# Patient Record
Sex: Female | Born: 1990 | Race: Black or African American | Hispanic: No | Marital: Single | Smoking: Never smoker
Health system: Southern US, Community
[De-identification: ages and names within clinical notes are randomized; demographics above are authoritative.]

## PROBLEM LIST (undated history)

## (undated) DIAGNOSIS — E559 Vitamin D deficiency, unspecified: Secondary | ICD-10-CM

## (undated) DIAGNOSIS — B019 Varicella without complication: Secondary | ICD-10-CM

## (undated) HISTORY — DX: Vitamin D deficiency, unspecified: E55.9

## (undated) HISTORY — DX: Varicella without complication: B01.9

---

## 2014-09-06 ENCOUNTER — Encounter: Payer: Self-pay | Admitting: Family Medicine

## 2014-09-06 ENCOUNTER — Ambulatory Visit (INDEPENDENT_AMBULATORY_CARE_PROVIDER_SITE_OTHER): Payer: BLUE CROSS/BLUE SHIELD | Admitting: Family Medicine

## 2014-09-06 VITALS — BP 124/72 | HR 72 | Temp 95.5°F | Ht 61.5 in | Wt 96.2 lb

## 2014-09-06 DIAGNOSIS — R300 Dysuria: Secondary | ICD-10-CM | POA: Diagnosis not present

## 2014-09-06 DIAGNOSIS — N3000 Acute cystitis without hematuria: Secondary | ICD-10-CM

## 2014-09-06 LAB — POCT URINALYSIS DIPSTICK
BILIRUBIN UA: NEGATIVE
Glucose, UA: NEGATIVE
Ketones, UA: NEGATIVE
NITRITE UA: NEGATIVE
UROBILINOGEN UA: NEGATIVE
pH, UA: 6

## 2014-09-06 MED ORDER — CIPROFLOXACIN HCL 250 MG PO TABS: 250.0000 mg | ORAL_TABLET | Freq: Two times a day (BID) | ORAL | Status: DC

## 2014-09-06 NOTE — Patient Instructions (Signed)
Take the antibiotics twice daily for 3 days.  Call in 2 days if you aren't any better.  Call/return immediately if high fevers, vomiting, flank pain.  Return for physical/pap smear.  Urinary Tract Infection Urinary tract infections (UTIs) can develop anywhere along your urinary tract. Your urinary tract is your body's drainage system for removing wastes and extra water. Your urinary tract includes two kidneys, two ureters, a bladder, and a urethra. Your kidneys are a pair of bean-shaped organs. Each kidney is about the size of your fist. They are located below your ribs, one on each side of your spine. CAUSES Infections are caused by microbes, which are microscopic organisms, including fungi, viruses, and bacteria. These organisms are so small that they can only be seen through a microscope. Bacteria are the microbes that most commonly cause UTIs. SYMPTOMS  Symptoms of UTIs may vary by age and gender of the patient and by the location of the infection. Symptoms in young women typically include a frequent and intense urge to urinate and a painful, burning feeling in the bladder or urethra during urination. Older women and men are more likely to be tired, shaky, and weak and have muscle aches and abdominal pain. A fever may mean the infection is in your kidneys. Other symptoms of a kidney infection include pain in your back or sides below the ribs, nausea, and vomiting. DIAGNOSIS To diagnose a UTI, your caregiver will ask you about your symptoms. Your caregiver also will ask to provide a urine sample. The urine sample will be tested for bacteria and white blood cells. White blood cells are made by your body to help fight infection. TREATMENT  Typically, UTIs can be treated with medication. Because most UTIs are caused by a bacterial infection, they usually can be treated with the use of antibiotics. The choice of antibiotic and length of treatment depend on your symptoms and the type of bacteria causing  your infection. HOME CARE INSTRUCTIONS  If you were prescribed antibiotics, take them exactly as your caregiver instructs you. Finish the medication even if you feel better after you have only taken some of the medication.  Drink enough water and fluids to keep your urine clear or pale yellow.  Avoid caffeine, tea, and carbonated beverages. They tend to irritate your bladder.  Empty your bladder often. Avoid holding urine for long periods of time.  Empty your bladder before and after sexual intercourse.  After a bowel movement, women should cleanse from front to back. Use each tissue only once. SEEK MEDICAL CARE IF:   You have back pain.  You develop a fever.  Your symptoms do not begin to resolve within 3 days. SEEK IMMEDIATE MEDICAL CARE IF:   You have severe back pain or lower abdominal pain.  You develop chills.  You have nausea or vomiting.  You have continued burning or discomfort with urination. MAKE SURE YOU:   Understand these instructions.  Will watch your condition.  Will get help right away if you are not doing well or get worse. Document Released: 03/07/2005 Document Revised: 11/27/2011 Document Reviewed: 07/06/2011 [facility] Patient Information 2015 [place], [place]. This information is not intended to replace advice given to you by your health care provider. Make sure you discuss any questions you have with your health care provider.

## 2014-09-06 NOTE — Progress Notes (Signed)
Chief Complaint  Patient presents with  . UTI    new pt possible UTI, frequency, discomfort when urinating.    She presents with complaint of frequent urination and painful urination (pain where the urine exits); frequent voids, small amounts.  No blood in the urine.  Denies lower abdominal pain or flank pain.  Symptoms started 2 days ago, hasn't changed in intensity.  No fevers, chills, nausea, or vomiting.  Symptoms started in the afternoon 2 days ago, but earlier that morning she had some urinary incontinence (while having a dream that she was going to the bathroom and voiding, but she was really still in bed).  She has had one UTI in the past (3 years ago).  This feels similar, but not as bad, catching it early.  She is trying to drink a lot of water.  Denies vaginal discharge, odor, itch. LMP 3/16-20. She is in a sexual relationship, uses condoms every time. She reports she has never had a pap smear.  History reviewed. No pertinent past medical history.  History reviewed. No pertinent past surgical history.  History   Social History  . Marital Status: Single    Spouse Name: N/A  . Number of Children: N/A  . Years of Education: N/A   Occupational History  . student    Social History Main Topics  . Smoking status: Never Smoker   . Smokeless tobacco: Never Used  . Alcohol Use: 0.0 oz/week    0 Standard drinks or equivalent per week     Comment: occasionally  . Drug Use: No  . Sexual Activity:    Partners: Male    Birth Control/ Protection: Condom   Other Topics Concern  . Not on file   Social History Narrative   Student at [employer], lives on campus.  [place]. Studying nursing and psychology.  Will graduate 05/2015    Family History  Problem Relation Age of Onset  . Hypertension Mother   . Hyperlipidemia Father   . Cancer Maternal Grandmother     breast  . Breast cancer Maternal Grandmother [id number]  . Diabetes Maternal Grandfather   . Diabetes Paternal  Grandfather   . Heart disease Neg Hx   . Stroke Neg Hx     Outpatient Encounter Prescriptions as of 09/06/2014  Medication Sig  . ciprofloxacin (CIPRO) 250 MG tablet Take 1 tablet (250 mg total) by mouth 2 (two) times daily.    No Known Allergies  ROS:  No fevers, chills, headaches, dizziness.  No nausea, vomiting, diarrhea.  Some mild allergies--sneezing, itchy eyes and slight cough. No ear pain, sore throat. No bleeding, bruising, rashes.  No joint pain, swelling.  No mood changes or insomnia. No chest pain, palpitations or shortness of breath. Urinary complaints as per HPI.    PHYSICAL EXAM: BP 124/72 mmHg  Pulse 72  Temp(Src) 95.5 F (35.3 C) (Temporal)  Ht 5' 1.5" (1.562 m)  Wt 96 lb 3.2 oz (43.636 kg)  BMI 17.88 kg/m2 Well developed, petite, pleasant female in no distress HEENT: PERRL, EOMI, conjunctiva clear.  TM's normal, OP clear Neck: no lymphadenopathy, thyromegaly or mass Heart: regular rate and rhythm Lungs: clear bilaterally Abdomen: soft, nontender, no organomegaly or mass. No suprapubic tenderness Back: no spinal or CVA tenderness Extremities: no edema, 2+ pulse Skin: no rashes/lesions Psych: normal mood, affect, hygiene and grooming Neuro: alert and oriented.  Cranial nerves intact, normal strength, gait   Urine dip:  1+ blood, 1+ leuks, SG 1.030  ASSESSMENT/PLAN:  Acute cystitis without hematuria - Plan: ciprofloxacin (CIPRO) 250 MG tablet, Urine culture, CANCELED: Urine culture  Dysuria - Plan: POCT urinalysis dipstick   F/u for CPE/pap. To get copy of immunization for visit  Reviewed plan B; continue regular condom use. Discussed use of AZO for no more than 2 days, if needed.    Advised of s/sx of worsening urinary infection, expected course.

## 2014-09-09 LAB — URINE CULTURE: Colony Count: >=100000

## 2014-10-14 ENCOUNTER — Encounter: Payer: Self-pay | Admitting: Family Medicine

## 2014-10-14 ENCOUNTER — Ambulatory Visit (INDEPENDENT_AMBULATORY_CARE_PROVIDER_SITE_OTHER): Payer: BLUE CROSS/BLUE SHIELD | Admitting: Family Medicine

## 2014-10-14 VITALS — BP 120/70 | HR 68 | Temp 96.8°F | Ht 61.0 in | Wt 97.0 lb

## 2014-10-14 DIAGNOSIS — N3000 Acute cystitis without hematuria: Secondary | ICD-10-CM

## 2014-10-14 DIAGNOSIS — R300 Dysuria: Secondary | ICD-10-CM

## 2014-10-14 LAB — POCT URINALYSIS DIPSTICK
BILIRUBIN UA: NEGATIVE
Glucose, UA: NEGATIVE
Ketones, UA: NEGATIVE
Nitrite, UA: NEGATIVE
PH UA: 6
Spec Grav, UA: >=1.03
Urobilinogen, UA: NEGATIVE

## 2014-10-14 MED ORDER — SULFAMETHOXAZOLE-TRIMETHOPRIM 800-160 MG PO TABS: 1.0000 | ORAL_TABLET | Freq: Two times a day (BID) | ORAL | Status: DC

## 2014-10-14 NOTE — Patient Instructions (Addendum)
Drink plenty of water. Take antibiotic twice daily with food for a week (treating longer, since recurrence was pretty quick). Call us if you aren't tolerating this medication, to have it changed. Call us if you develop fever, chills, flank pain, vomiting, or other new concerns. Remember to wipe front to back, and void after intercourse.  Try and drink cranberry juice at first symptoms of any possible recurrence.   Urinary Tract Infection Urinary tract infections (UTIs) can develop anywhere along your urinary tract. Your urinary tract is your body's drainage system for removing wastes and extra water. Your urinary tract includes two kidneys, two ureters, a bladder, and a urethra. Your kidneys are a pair of bean-shaped organs. Each kidney is about the size of your fist. They are located below your ribs, one on each side of your spine. CAUSES Infections are caused by microbes, which are microscopic organisms, including fungi, viruses, and bacteria. These organisms are so small that they can only be seen through a microscope. Bacteria are the microbes that most commonly cause UTIs. SYMPTOMS  Symptoms of UTIs may vary by age and gender of the patient and by the location of the infection. Symptoms in young women typically include a frequent and intense urge to urinate and a painful, burning feeling in the bladder or urethra during urination. Older women and men are more likely to be tired, shaky, and weak and have muscle aches and abdominal pain. A fever may mean the infection is in your kidneys. Other symptoms of a kidney infection include pain in your back or sides below the ribs, nausea, and vomiting. DIAGNOSIS To diagnose a UTI, your caregiver will ask you about your symptoms. Your caregiver also will ask to provide a urine sample. The urine sample will be tested for bacteria and white blood cells. White blood cells are made by your body to help fight infection. TREATMENT  Typically, UTIs can be  treated with medication. Because most UTIs are caused by a bacterial infection, they usually can be treated with the use of antibiotics. The choice of antibiotic and length of treatment depend on your symptoms and the type of bacteria causing your infection. HOME CARE INSTRUCTIONS  If you were prescribed antibiotics, take them exactly as your caregiver instructs you. Finish the medication even if you feel better after you have only taken some of the medication.  Drink enough water and fluids to keep your urine clear or pale yellow.  Avoid caffeine, tea, and carbonated beverages. They tend to irritate your bladder.  Empty your bladder often. Avoid holding urine for long periods of time.  Empty your bladder before and after sexual intercourse.  After a bowel movement, women should cleanse from front to back. Use each tissue only once. SEEK MEDICAL CARE IF:   You have back pain.  You develop a fever.  Your symptoms do not begin to resolve within 3 days. SEEK IMMEDIATE MEDICAL CARE IF:   You have severe back pain or lower abdominal pain.  You develop chills.  You have nausea or vomiting.  You have continued burning or discomfort with urination. MAKE SURE YOU:   Understand these instructions.  Will watch your condition.  Will get help right away if you are not doing well or get worse. Document Released: 03/07/2005 Document Revised: 11/27/2011 Document Reviewed: 07/06/2011 [facility] Patient Information 2015 [place], [place]. This information is not intended to replace advice given to you by your health care provider. Make sure you discuss any questions you have with  your health care provider.

## 2014-10-14 NOTE — Progress Notes (Signed)
Chief Complaint  Patient presents with  . Burning with urination    and some increased frequency since this past Sunday.    4 days ago she started with pain with urination, and then also started with urinary frequency.  She has been taking Azo, which helps with the discomfort.  Denies fevers, chills, abdominal or back pain.  Denies vaginal discharge, odor, itch.  She thinks she hasn't been drinking enough water, eating a lot of sweets.  Last UTI was the end of March, treated with Cipro.  Urine culture showed E.coli, sensitive to all antibiotics.  PMH, PSH, SH reviewed.  No current outpatient prescriptions on file prior to visit.   No current facility-administered medications on file prior to visit.   No Known Allergies  ROS: no fever, chills, nausea, vomiting, bleeding, bruising, rash, URI symptoms, cough, shortness of breath, chest pain, vaginal discharge, joint pains or other concerns.  PHYSICAL EXAM: BP 120/70 mmHg  Pulse 68  Temp(Src) 96.8 F (36 C) (Tympanic)  Ht 5\' 1"  (1.549 m)  Wt 97 lb (43.999 kg)  BMI 18.34 kg/m2  LMP 09/21/2014 Well developed, pleasant, thin female in no distress Back: no CVA tenderness Abdomen: no suprapubic tenderness, soft Heart: regular rate and rhythm Lungs: clear bilaterally Extremities: no edema   Color, UA yellow   Clarity, UA cloudy   Glucose, UA neg   Bilirubin, UA neg   Ketones, UA neg   Spec Grav, UA >=1.030   Blood, UA 2+   Comments: `   pH, UA 6.0   Protein, UA trace   Urobilinogen, UA negative   Nitrite, UA neg   Leukocytes, UA large (3+)        Review of culture from March showed e.coli, pan-sensitive.  ASSESSMENT/PLAN:  Acute cystitis without hematuria - Plan: sulfamethoxazole-trimethoprim (BACTRIM DS,SEPTRA DS) 800-160 MG per tablet, Urine culture  Burning with urination - Plan: POCT Urinalysis Dipstick   Drink plenty of water. Take antibiotic twice daily with food for a week (treating longer,  since recurrence was pretty quick). Call us if you aren't tolerating this medication, to have it changed. Call us if you develop fever, chills, flank pain, vomiting, or other new concerns. Remember to wipe front to back, and void after intercourse.  Try and drink cranberry juice at first symptoms of any possible recurrence.

## 2014-10-17 LAB — URINE CULTURE: Colony Count: >=100000

## 2014-11-18 ENCOUNTER — Encounter: Payer: Self-pay | Admitting: Family Medicine

## 2014-11-18 ENCOUNTER — Other Ambulatory Visit (HOSPITAL_COMMUNITY)
Admission: RE | Admit: 2014-11-18 | Discharge: 2014-11-18 | Disposition: A | Discharge status: Home / Self Care | Payer: BLUE CROSS/BLUE SHIELD | Source: Ambulatory Visit | Attending: Family Medicine | Admitting: Family Medicine

## 2014-11-18 ENCOUNTER — Ambulatory Visit (INDEPENDENT_AMBULATORY_CARE_PROVIDER_SITE_OTHER): Payer: BLUE CROSS/BLUE SHIELD | Admitting: Family Medicine

## 2014-11-18 VITALS — BP 112/70 | HR 76 | Ht 61.0 in | Wt 94.4 lb

## 2014-11-18 DIAGNOSIS — Z0000 Encounter for general adult medical examination without abnormal findings: Secondary | ICD-10-CM

## 2014-11-18 DIAGNOSIS — Z8349 Family history of other endocrine, nutritional and metabolic diseases: Secondary | ICD-10-CM | POA: Diagnosis not present

## 2014-11-18 DIAGNOSIS — R5383 Other fatigue: Secondary | ICD-10-CM

## 2014-11-18 DIAGNOSIS — Z113 Encounter for screening for infections with a predominantly sexual mode of transmission: Secondary | ICD-10-CM | POA: Diagnosis present

## 2014-11-18 DIAGNOSIS — Z01419 Encounter for gynecological examination (general) (routine) without abnormal findings: Secondary | ICD-10-CM | POA: Diagnosis not present

## 2014-11-18 DIAGNOSIS — Z83438 Family history of other disorder of lipoprotein metabolism and other lipidemia: Secondary | ICD-10-CM

## 2014-11-18 DIAGNOSIS — Z114 Encounter for screening for human immunodeficiency virus [HIV]: Secondary | ICD-10-CM

## 2014-11-18 DIAGNOSIS — Z23 Encounter for immunization: Secondary | ICD-10-CM

## 2014-11-18 DIAGNOSIS — Z13220 Encounter for screening for lipoid disorders: Secondary | ICD-10-CM

## 2014-11-18 LAB — POCT URINALYSIS DIPSTICK
Bilirubin, UA: NEGATIVE
Blood, UA: NEGATIVE
Glucose, UA: NEGATIVE
Ketones, UA: NEGATIVE
Leukocytes, UA: NEGATIVE
Nitrite, UA: NEGATIVE
PROTEIN UA: NEGATIVE
UROBILINOGEN UA: NEGATIVE
pH, UA: 6

## 2014-11-18 NOTE — Progress Notes (Signed)
Chief Complaint  Patient presents with  . cpe    cpe and pap. no other concerns   Carolyn Juarez is a 24 y.o. female who presents for a complete physical, including her first pap smear.  She has no particular concerns.  She had a UTI last month, that completely resolved with Septra.  No recurrent symptoms.  There is no immunization history on file for this patient.  [facility] reviewed.  She had one dose of meningitis vaccine in 4/08.  Tdap 4/08. Had HPV series. H/o varicella and she recalls having + titer checked.  Never had Hepatitis A series or MenB  Last Pap smear: never Last mammogram: never Last colonoscopy: never Last DEXA: never Dentist: twice yearly Ophtho: never (20/20 on screens in the past) Exercise:  occasionally walks with her mom She drinks a lot of milk.  Past Medical History  Diagnosis Date  . Varicella     as a child; had + titers checked elsewhere    History reviewed. No pertinent past surgical history.  History   Social History  . Marital Status: Single    Spouse Name: N/A  . Number of Children: N/A  . Years of Education: N/A   Occupational History  . student    Social History Main Topics  . Smoking status: Never Smoker   . Smokeless tobacco: Never Used  . Alcohol Use: 0.0 oz/week    0 Standard drinks or equivalent per week     Comment: occasionally  . Drug Use: No  . Sexual Activity:    Partners: Male    Birth Control/ Protection: Condom   Other Topics Concern  . Not on file   Social History Narrative   Student at [employer], lives on campus.  Parents live in [place]. Studying nursing and psychology.  Will graduate 05/2015    Family History  Problem Relation Age of Onset  . Hypertension Mother   . Hyperlipidemia Father   . Cancer Maternal Grandmother     breast  . Breast cancer Maternal Grandmother 53  . Diabetes Maternal Grandfather   . Diabetes Paternal Grandfather   . Heart disease Neg Hx   . Stroke Neg Hx     Outpatient Encounter  Prescriptions as of 11/18/2014  Medication Sig Note  . Multiple Vitamins-Minerals (HAIR/SKIN/NAILS PO) Take 3 tablets by mouth daily. 11/18/2014: gummies  . [DISCONTINUED] sulfamethoxazole-trimethoprim (BACTRIM DS,SEPTRA DS) 800-160 MG per tablet Take 1 tablet by mouth 2 (two) times daily.    No facility-administered encounter medications on file as of 11/18/2014.    No Known Allergies  ROS:  The patient denies anorexia, fever, weight changes, headaches,  vision changes, decreased hearing, ear pain, sore throat, breast concerns, chest pain, palpitations, dizziness, syncope, dyspnea on exertion, cough, swelling, nausea, vomiting, diarrhea, constipation, abdominal pain, melena, hematochezia, indigestion/heartburn, hematuria, incontinence, dysuria, irregular menstrual cycles, vaginal discharge, odor or itch, genital lesions, joint pains, numbness, tingling, weakness, tremor, suspicious skin lesions, depression, anxiety, abnormal bleeding/bruising, or enlarged lymph nodes.   PHYSICAL EXAM:  BP 112/70 mmHg  Pulse 76  Ht 5\' 1"  (1.549 m)  Wt 94 lb 6.4 oz (42.82 kg)  BMI 17.85 kg/m2  General Appearance:    Alert, cooperative, no distress, appears stated age  Head:    Normocephalic, without obvious abnormality, atraumatic  Eyes:    PERRL, conjunctiva/corneas clear, EOM's intact, fundi    benign  Ears:    Normal TM's and external ear canals  Nose:   Nares normal, mucosa normal, no drainage  or sinus   tenderness  Throat:   Lips, mucosa, and tongue normal; teeth and gums normal  Neck:   Supple, no lymphadenopathy;  thyroid:  no   enlargement/tenderness/nodules; no carotid   bruit or JVD  Back:    Spine nontender, no curvature, ROM normal, no CVA     tenderness  Lungs:     Clear to auscultation bilaterally without wheezes, rales or     ronchi; respirations unlabored  Chest Wall:    No tenderness or deformity   Heart:    Regular rate and rhythm, S1 and S2 normal, no murmur, rub   or gallop  Breast  Exam:    No tenderness, masses, or nipple discharge or inversion.  Fibrocystic changes/dense noted, no discrete mass.  No axillary lymphadenopathy  Abdomen:     Soft, non-tender, nondistended, normoactive bowel sounds,    no masses, no hepatosplenomegaly  Genitalia:    Normal external genitalia without lesions.  BUS and vagina normal; cervix without lesions, or cervical motion tenderness. No abnormal vaginal discharge.  Uterus and adnexa not enlarged, nontender, no masses.  Pap performed  Rectal:    Not performed due to age<40 and no related complaints  Extremities:   No clubbing, cyanosis or edema  Pulses:   2+ and symmetric all extremities  Skin:   Skin color, texture, turgor normal, no rashes or lesions  Lymph nodes:   Cervical, supraclavicular, and axillary nodes normal  Neurologic:   CNII-XII intact, normal strength, sensation and gait; reflexes 2+ and symmetric throughout          Psych:   Normal mood, affect, hygiene and grooming.    Urine dip: normal   ASSESSMENT/PLAN:  Routine general medical examination at a health care facility - Plan: POCT urinalysis dipstick, CBC with Differential/Platelet, Cytology - PAP [place], Lipid panel, Comprehensive metabolic panel, CBC with Differential/Platelet, Vit D  25 hydroxy (rtn osteoporosis monitoring), TSH, CANCELED: Lipid panel, CANCELED: Comprehensive metabolic panel, CANCELED: Vit D  25 hydroxy (rtn osteoporosis monitoring), CANCELED: TSH, CANCELED: HIV Ag/Ab Combo with Reflex  Other fatigue - Plan: CBC with Differential/Platelet, Comprehensive metabolic panel, CBC with Differential/Platelet, Vit D  25 hydroxy (rtn osteoporosis monitoring), TSH, CANCELED: Comprehensive metabolic panel, CANCELED: Vit D  25 hydroxy (rtn osteoporosis monitoring), CANCELED: TSH  Family history of hyperlipidemia - Plan: Lipid panel, CANCELED: Lipid panel  Screening for lipid disorders - Plan: Lipid panel, CANCELED: Lipid panel  Screening for HIV (human  immunodeficiency virus) - Plan: HIV antibody, CANCELED: HIV Ag/Ab Combo with Reflex  Immunization due - Plan: Hepatitis A vaccine adult IM, Meningococcal conjugate vaccine 4-valent IM, Meningococcal B, OMV (Bexsero)  Labs are nonfasting--she last ate cereal 4 hours prior.  Discussed monthly self breast exams and yearly mammograms after the age of 62; at least 30 minutes of aerobic activity at least 5 days/week, weight-bearing exercise at least 2x/week; proper sunscreen use reviewed; healthy diet, including goals of calcium and vitamin D intake and alcohol recommendations (less than or equal to 1 drink/day) reviewed; regular seatbelt use; changing batteries in smoke detectors.  Immunization recommendations discussed--see below.    Safe sex, plan B discussed.  Declines other contraception at this time.  2nd meningitis (menveo or menactra) given Bexsero #1 given Hepatitis A #1  F/u 1 year for CPE 1 month NV for 2nd Bexsero 6 month NV for 2nd Hepatitis A

## 2014-11-18 NOTE — Patient Instructions (Addendum)
  HEALTH MAINTENANCE RECOMMENDATIONS:  It is recommended that you get at least 30 minutes of aerobic exercise at least 5 days/week (for weight loss, you may need as much as 60-90 minutes). This can be any activity that gets your heart rate up. This can be divided in 10-15 minute intervals if needed, but try and build up your endurance at least once a week.  Weight bearing exercise is also recommended twice weekly.  Eat a healthy diet with lots of vegetables, fruits and fiber.  "Colorful" foods have a lot of vitamins (ie green vegetables, tomatoes, red peppers, etc).  Limit sweet tea, regular sodas and alcoholic beverages, all of which has a lot of calories and sugar.  Up to 1 alcoholic drink daily may be beneficial for women (unless trying to lose weight, watch sugars).  Drink a lot of water.  Calcium recommendations are 1200-1500 mg daily (1500 mg for postmenopausal women or women without ovaries), and vitamin D 1000 IU daily.  This should be obtained from diet and/or supplements (vitamins), and calcium should not be taken all at once, but in divided doses.  Monthly self breast exams and yearly mammograms for women over the age of 80 is recommended.  Sunscreen of at least SPF 30 should be used on all sun-exposed parts of the skin when outside between the hours of 10 am and 4 pm (not just when at beach or pool, but even with exercise, golf, tennis, and yard work!)  Use a sunscreen that says "broad spectrum" so it covers both UVA and UVB rays, and make sure to reapply every 1-2 hours.  Remember to change the batteries in your smoke detectors when changing your clock times in the spring and fall.  Use your seat belt every time you are in a car, and please drive safely and not be distracted with cell phones and texting while driving.  Continue regular condom use.  Remember about Plan B if the condom breaks or if you forget to use one, in order to prevent an unwanted pregnancy.

## 2014-11-19 LAB — LIPID PANEL
CHOLESTEROL: 161 mg/dL (ref 0–200)
HDL: 77 mg/dL (ref >=46)
LDL Cholesterol: 68 mg/dL (ref 0–99)
TRIGLYCERIDES: 78 mg/dL (ref <150)
Total CHOL/HDL Ratio: 2.1 Ratio
VLDL: 16 mg/dL (ref 0–40)

## 2014-11-19 LAB — COMPREHENSIVE METABOLIC PANEL
ALBUMIN: 4.7 g/dL (ref 3.5–5.2)
ALK PHOS: 47 U/L (ref 39–117)
ALT: 14 U/L (ref 0–35)
AST: 15 U/L (ref 0–37)
BILIRUBIN TOTAL: 0.5 mg/dL (ref 0.2–1.2)
BUN: 11 mg/dL (ref 6–23)
CALCIUM: 9.7 mg/dL (ref 8.4–10.5)
CHLORIDE: 103 meq/L (ref 96–112)
CO2: 25 meq/L (ref 19–32)
Creat: 0.66 mg/dL (ref 0.50–1.10)
GLUCOSE: 84 mg/dL (ref 70–99)
POTASSIUM: 3.9 meq/L (ref 3.5–5.3)
Sodium: 138 mEq/L (ref 135–145)
Total Protein: 7.5 g/dL (ref 6.0–8.3)

## 2014-11-19 LAB — CBC WITH DIFFERENTIAL/PLATELET
BASOS ABS: 0 10*3/uL (ref 0.0–0.1)
BASOS PCT: 0 % (ref 0–1)
EOS PCT: 2 % (ref 0–5)
Eosinophils Absolute: 0.2 10*3/uL (ref 0.0–0.7)
HCT: 40.7 % (ref 36.0–46.0)
Hemoglobin: 14 g/dL (ref 12.0–15.0)
LYMPHS PCT: 20 % (ref 12–46)
Lymphs Abs: 2.3 10*3/uL (ref 0.7–4.0)
MCH: 29.1 pg (ref 26.0–34.0)
MCHC: 34.4 g/dL (ref 30.0–36.0)
MCV: 84.6 fL (ref 78.0–100.0)
MONOS PCT: 6 % (ref 3–12)
MPV: 11.2 fL (ref 8.6–12.4)
Monocytes Absolute: 0.7 10*3/uL (ref 0.1–1.0)
Neutro Abs: 8.4 10*3/uL — ABNORMAL HIGH (ref 1.7–7.7)
Neutrophils Relative %: 72 % (ref 43–77)
Platelets: 265 10*3/uL (ref 150–400)
RBC: 4.81 MIL/uL (ref 3.87–5.11)
RDW: 15.9 % — AB (ref 11.5–15.5)
WBC: 11.6 10*3/uL — AB (ref 4.0–10.5)

## 2014-11-19 LAB — HIV ANTIBODY (ROUTINE TESTING W REFLEX): HIV 1&2 Ab, 4th Generation: NONREACTIVE

## 2014-11-20 LAB — TSH: TSH: 0.519 u[IU]/mL (ref 0.350–4.500)

## 2014-11-20 LAB — VITAMIN D 25 HYDROXY (VIT D DEFICIENCY, FRACTURES): Vit D, 25-Hydroxy: 26 ng/mL — ABNORMAL LOW (ref 30–100)

## 2014-11-24 ENCOUNTER — Encounter: Payer: Self-pay | Admitting: Family Medicine

## 2014-11-24 LAB — CYTOLOGY - PAP

## 2014-12-20 ENCOUNTER — Other Ambulatory Visit: Payer: Self-pay

## 2014-12-23 ENCOUNTER — Other Ambulatory Visit (INDEPENDENT_AMBULATORY_CARE_PROVIDER_SITE_OTHER): Payer: BLUE CROSS/BLUE SHIELD

## 2014-12-23 DIAGNOSIS — Z23 Encounter for immunization: Secondary | ICD-10-CM

## 2015-05-23 ENCOUNTER — Other Ambulatory Visit: Payer: Self-pay

## 2015-11-21 ENCOUNTER — Encounter: Payer: Self-pay | Admitting: Family Medicine

## 2015-11-21 ENCOUNTER — Other Ambulatory Visit (HOSPITAL_COMMUNITY)
Admission: RE | Admit: 2015-11-21 | Discharge: 2015-11-21 | Disposition: A | Discharge status: Home / Self Care | Payer: BLUE CROSS/BLUE SHIELD | Source: Ambulatory Visit | Attending: Family Medicine | Admitting: Family Medicine

## 2015-11-21 ENCOUNTER — Ambulatory Visit (INDEPENDENT_AMBULATORY_CARE_PROVIDER_SITE_OTHER): Payer: BLUE CROSS/BLUE SHIELD | Admitting: Family Medicine

## 2015-11-21 VITALS — BP 96/52 | HR 80 | Ht 61.0 in | Wt 101.6 lb

## 2015-11-21 DIAGNOSIS — Z23 Encounter for immunization: Secondary | ICD-10-CM

## 2015-11-21 DIAGNOSIS — Z01419 Encounter for gynecological examination (general) (routine) without abnormal findings: Secondary | ICD-10-CM | POA: Diagnosis present

## 2015-11-21 DIAGNOSIS — E559 Vitamin D deficiency, unspecified: Secondary | ICD-10-CM | POA: Diagnosis not present

## 2015-11-21 DIAGNOSIS — Z0000 Encounter for general adult medical examination without abnormal findings: Secondary | ICD-10-CM

## 2015-11-21 LAB — POCT URINALYSIS DIPSTICK
Bilirubin, UA: NEGATIVE
Blood, UA: NEGATIVE
GLUCOSE UA: NEGATIVE
Ketones, UA: NEGATIVE
LEUKOCYTES UA: NEGATIVE
NITRITE UA: NEGATIVE
PROTEIN UA: NEGATIVE
Spec Grav, UA: >=1.03
UROBILINOGEN UA: NEGATIVE
pH, UA: 5.5

## 2015-11-21 NOTE — Progress Notes (Signed)
Chief Complaint  Patient presents with  . Annual Exam    nonfasting annual exam (had cereal at 9:00am) with pap. No concerns.    Carolyn Juarez is a 25 y.o. female who presents for a complete physical.  She has the following concerns: None. She admits she forgot to return to get the second Hepatitis A vaccine.  She was found to have a borderline low Vitamin D last year.  She took a vitamin D supplement for a while, but forgot about it.  Hasn't taken it in about 6 months.  Immunization History  Administered Date(s) Administered  . DTaP 05/10/1992, 07/11/1992, 09/30/1992, 06/23/1993, 04/20/1996  . HPV Quadrivalent 09/16/2006, 12/25/2006, 05/07/2007  . Hepatitis A, Adult 11/18/2014  . Hepatitis B 04/08/1992, 05/10/1992, 10/17/1992  . HiB (PRP-OMP) 05/10/1992, 09/30/1992, 06/23/1993  . IPV 05/10/1992, 07/11/1992, 09/30/1992, 04/20/1996  . MMR 06/23/1993, 04/20/1996  . Meningococcal B, OMV 11/18/2014, 12/23/2014  . Meningococcal Conjugate 09/16/2006, 11/18/2014  . Tdap 09/16/2006  she doesn't recall getting a flu shot this past year Last Pap smear: 11/2014, normal, and negative chlamydia screen; she is with a new partner this year Last mammogram: never Last colonoscopy: never Last DEXA: never Dentist: twice yearly Ophtho: never  Exercise: Walks three days/week for an hour (with her mom and her friends).  Some lifting at work (turning clients). No longer getting as much milk in her diet (only when she eats cereal, which is sporadic).  Lipids: Lab Results  Component Value Date   CHOL 161 11/18/2014   HDL 77 11/18/2014   LDLCALC 68 11/18/2014   TRIG 78 11/18/2014   CHOLHDL 2.1 11/18/2014   Lab Results  Component Value Date   WBC 11.6* 11/18/2014   HGB 14.0 11/18/2014   HCT 40.7 11/18/2014   MCV 84.6 11/18/2014   PLT 265 11/18/2014     Chemistry      Component Value Date/Time   NA 138 11/18/2014 1017   K 3.9 11/18/2014 1017   CL 103 11/18/2014 1017   CO2 25 11/18/2014 1017    BUN 11 11/18/2014 1017   CREATININE 0.66 11/18/2014 1017      Component Value Date/Time   CALCIUM 9.7 11/18/2014 1017   ALKPHOS 47 11/18/2014 1017   AST 15 11/18/2014 1017   ALT 14 11/18/2014 1017   BILITOT 0.5 11/18/2014 1017     Vitamin D-OH 26 in 11/2014  Past Medical History  Diagnosis Date  . Varicella     as a child; had + titers checked elsewhere    History reviewed. No pertinent past surgical history.  Social History   Social History  . Marital Status: Single    Spouse Name: N/A  . Number of Children: N/A  . Years of Education: N/A   Occupational History  . Caregiver    Social History Main Topics  . Smoking status: Never Smoker   . Smokeless tobacco: Never Used  . Alcohol Use: 0.0 oz/week    0 Standard drinks or equivalent per week     Comment: rarely  . Drug Use: No  . Sexual Activity:    Partners: Male    Birth Control/ Protection: Condom   Other Topics Concern  . Not on file   Social History Narrative   Graduated from [employer] in 05/2015; lives with parents.  Studying nursing and psychology. 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  Hypertension Mother   . Hyperlipidemia Father   . Cancer Maternal Grandmother     breast  . Breast cancer Maternal Grandmother 27  . Diabetes Maternal Grandfather   . Diabetes Paternal Grandfather   . Heart disease Neg Hx   . Stroke Neg Hx     Outpatient Encounter Prescriptions as of 11/21/2015  Medication Sig Note  . [DISCONTINUED] Multiple Vitamins-Minerals (HAIR/SKIN/NAILS PO) Take 3 tablets by mouth daily. 11/18/2014: gummies   No facility-administered encounter medications on file as of 11/21/2015.    No Known Allergies   ROS: The patient denies anorexia, fever, weight changes, headaches, vision changes, decreased hearing, ear pain, sore throat, breast concerns, chest pain, palpitations, dizziness, syncope,  dyspnea on exertion, cough, swelling, nausea, vomiting, diarrhea, constipation, abdominal pain, melena, hematochezia, indigestion/heartburn, hematuria, incontinence, dysuria, irregular menstrual cycles, vaginal discharge, odor or itch, genital lesions, joint pains, numbness, tingling, weakness, tremor, suspicious skin lesions, depression, anxiety, abnormal bleeding/bruising, or enlarged lymph nodes.    PHYSICAL EXAM:  BP 96/52 mmHg  Pulse 80  Ht '5\' 1"'  (1.549 m)  Wt 101 lb 9.6 oz (46.085 kg)  BMI 19.21 kg/m2  LMP 11/13/2015  General Appearance:   Alert, cooperative, no distress, appears stated age  Head:   Normocephalic, without obvious abnormality, atraumatic  Eyes:   PERRL, conjunctiva/corneas clear, EOM's intact, fundi   benign  Ears:   Normal TM's and external ear canals  Nose:  Nares normal, mucosa normal, no drainage or sinus tenderness  Throat:  Lips, mucosa, and tongue normal; teeth and gums normal  Neck:  Supple, no lymphadenopathy; thyroid: no enlargement/tenderness/nodules; no carotid  bruit or JVD  Back:  Spine nontender, no curvature, ROM normal, no CVA tenderness  Lungs:   Clear to auscultation bilaterally without wheezes, rales or ronchi; respirations unlabored  Chest Wall:   No tenderness or deformity  Heart:   Regular rate and rhythm, S1 and S2 normal, no murmur, rub  or gallop  Breast Exam:   No tenderness, masses, or nipple discharge or inversion. MIld fibrocystic changes noted, no discrete mass. No axillary lymphadenopathy  Abdomen:   Soft, non-tender, nondistended, normoactive bowel sounds,   no masses, no hepatosplenomegaly  Genitalia:   Normal external genitalia without lesions. BUS and vagina normal; cervix without lesions, or cervical motion tenderness. No abnormal vaginal discharge. Uterus and adnexa not enlarged, nontender, no masses. Pap performed  Rectal:   Not performed due to age<40  and no related complaints  Extremities:  No clubbing, cyanosis or edema  Pulses:  2+ and symmetric all extremities  Skin:  Skin color, texture, turgor normal, no rashes or lesions  Lymph nodes:  Cervical, supraclavicular, and axillary nodes normal  Neurologic:  CNII-XII intact, normal strength, sensation and gait; reflexes 2+ and symmetric throughout   Psych: Normal mood, affect, hygiene and grooming.       Normal urine dip  ASSESSMENT/PLAN:  Annual physical exam - Plan: Visual acuity screening, POCT Urinalysis Dipstick, Cytology - PAP [place], HIV antibody  Vitamin D deficiency - Plan: VITAMIN D 25 Hydroxy (Vit-D Deficiency, Fractures)  Immunization due - Plan: Hepatitis A vaccine adult IM    Discussed monthly self breast exams and yearly mammograms after the age of 22; at least 30 minutes of aerobic activity at least 5 days/week, weight-bearing exercise at least 2x/week; proper sunscreen use reviewed; healthy diet, including goals of calcium and vitamin D intake and alcohol recommendations (less than or equal to 1 drink/day) reviewed; regular seatbelt use; changing batteries  in smoke detectors. Immunization recommendations discussed-- Hepatitis A #2 given today Td next year. Yearly flu shots recommended  Vitamin D level today  F/u 1 year, sooner prn

## 2015-11-21 NOTE — Patient Instructions (Addendum)
  HEALTH MAINTENANCE RECOMMENDATIONS:  It is recommended that you get at least 30 minutes of aerobic exercise at least 5 days/week (for weight loss, you may need as much as 60-90 minutes). This can be any activity that gets your heart rate up. This can be divided in 10-15 minute intervals if needed, but try and build up your endurance at least once a week. Weight bearing exercise is also recommended twice weekly.  Eat a healthy diet with lots of vegetables, fruits and fiber. "Colorful" foods have a lot of vitamins (ie green vegetables, tomatoes, red peppers, etc). Limit sweet tea, regular sodas and alcoholic beverages, all of which has a lot of calories and sugar. Up to 1 alcoholic drink daily may be beneficial for women (unless trying to lose weight, watch sugars). Drink a lot of water.  Calcium recommendations are 1200-1500 mg daily (1500 mg for postmenopausal women or women without ovaries), and vitamin D 1000 IU daily. This should be obtained from diet and/or supplements (vitamins), and calcium should not be taken all at once, but in divided doses.  Monthly self breast exams and yearly mammograms for women over the age of 24 is recommended.  Sunscreen of at least SPF 30 should be used on all sun-exposed parts of the skin when outside between the hours of 10 am and 4 pm (not just when at beach or pool, but even with exercise, golf, tennis, and yard work!) Use a sunscreen that says "broad spectrum" so it covers both UVA and UVB rays, and make sure to reapply every 1-2 hours.  Remember to change the batteries in your smoke detectors when changing your clock times in the spring and fall.  Use your seat belt every time you are in a car, and please drive safely and not be distracted with cell phones and texting while driving.  Continue regular condom use. Remember about Plan B if the condom breaks or if you forget to use one, in order to prevent an unwanted pregnancy.   Please get flu shots  in the Fall (if you get it elsewhere, please let us know the date you had it.)

## 2015-11-22 LAB — HIV ANTIBODY (ROUTINE TESTING W REFLEX): HIV 1&2 Ab, 4th Generation: NONREACTIVE

## 2015-11-22 LAB — VITAMIN D 25 HYDROXY (VIT D DEFICIENCY, FRACTURES): Vit D, 25-Hydroxy: 23 ng/mL — ABNORMAL LOW (ref 30–100)

## 2015-11-23 LAB — CYTOLOGY - PAP

## 2015-11-24 ENCOUNTER — Other Ambulatory Visit: Payer: Self-pay | Admitting: *Deleted

## 2015-11-24 ENCOUNTER — Encounter: Payer: Self-pay | Admitting: Family Medicine

## 2015-11-24 MED ORDER — ERGOCALCIFEROL 1.25 MG (50000 UT) PO CAPS: 50000.0000 [IU] | ORAL_CAPSULE | ORAL | Status: DC

## 2016-11-18 NOTE — Progress Notes (Signed)
Chief Complaint  Patient presents with  . Annual Exam    fasting annual exam with pap. UA showed trace blood and ketones, no symptoms. No concerns.     Carolyn Juarez is a 26 y.o. female who presents for a complete physical.  She has no concerns.  Vitamin D deficiency.  Level was 23 last year. She was treated with 12 weeks of prescription therapy, then asked to start 1000 IU daily. She has been taking a multivitamin, but admits a lot of inconsistency in taking it.   Immunization History  Administered Date(s) Administered  . DTaP 05/10/1992, 07/11/1992, 09/30/1992, 06/23/1993, 04/20/1996  . HPV Quadrivalent 09/16/2006, 12/25/2006, 05/07/2007  . Hepatitis A, Adult 11/18/2014, 11/21/2015  . Hepatitis B 04/08/1992, 05/10/1992, 10/17/1992  . HiB (PRP-OMP) 05/10/1992, 09/30/1992, 06/23/1993  . IPV 05/10/1992, 07/11/1992, 09/30/1992, 04/20/1996  . MMR 06/23/1993, 04/20/1996  . Meningococcal B, OMV 11/18/2014, 12/23/2014  . Meningococcal Conjugate 09/16/2006, 11/18/2014  . Tdap 09/16/2006    Last Pap smear: 11/2015, normal; no partner since last pap Last mammogram: never Last colonoscopy: never Last DEXA: never Dentist: twice yearly Ophtho: never  Exercise: Walks her dogs daily.  Some lifting at work (turning clients). Eats yogurt, cheese, puts milk in her cereal. Lipids: Lab Results  Component Value Date   CHOL 161 11/18/2014   HDL 77 11/18/2014   LDLCALC 68 11/18/2014   TRIG 78 11/18/2014   CHOLHDL 2.1 11/18/2014    Past Medical History:  Diagnosis Date  . Varicella    as a child; had + titers checked elsewhere    History reviewed. No pertinent surgical history.  Social History   Social History  . Marital status: Single    Spouse name: N/A  . Number of children: N/A  . Years of education: N/A   Occupational History  . Caregiver    Social History Main Topics  . Smoking status: Never Smoker  . Smokeless tobacco: Never Used  . Alcohol use 0.0 oz/week   Comment: occasional glass of wine  . Drug use: No  . Sexual activity: Not Currently    Partners: Male    Birth control/ protection: Condom   Other Topics Concern  . Not on file   Social History Narrative   Graduated from [employer] in 05/2015; lives with parents.    Works at [employer]. Plans to continue with nursing school. Has 2 large dogs (husky/German Shepherd mix)    Family History  Problem Relation Age of Onset  . Hypertension Mother   . Hyperlipidemia Father   . Cancer Maternal Grandmother        breast  . Breast cancer Maternal Grandmother 49  . Diabetes Maternal Grandfather   . Diabetes Paternal Grandfather   . Heart disease Neg Hx   . Stroke Neg Hx     Outpatient Encounter Prescriptions as of 11/19/2016  Medication Sig  . [DISCONTINUED] ergocalciferol (VITAMIN D2) 50000 units capsule Take 1 capsule (50,000 Units total) by mouth once a week.   No facility-administered encounter medications on file as of 11/19/2016.     No Known Allergies  ROS: The patient denies anorexia, fever, weight changes, headaches, vision changes, decreased hearing, ear pain, sore throat, breast concerns, chest pain, palpitations, dizziness, syncope, dyspnea on exertion, cough, swelling, nausea, vomiting, diarrhea, constipation, abdominal pain, melena, hematochezia, indigestion/heartburn, hematuria, incontinence, dysuria, irregular menstrual cycles, vaginal discharge, odor or itch, genital lesions, joint pains, numbness, tingling, weakness, tremor, suspicious skin lesions, depression, anxiety, abnormal bleeding/bruising, or enlarged lymph nodes.  PHYSICAL EXAM:  BP 124/80 (BP Location: Right Arm, Patient Position: Sitting, Cuff Size: Normal)   Pulse 80   Ht 5' 0.5" (1.537 m)   Wt 95 lb 3.2 oz (43.2 kg)   LMP 11/19/2016 (Exact Date)   BMI 18.29 kg/m   Wt Readings from Last 3 Encounters:  11/19/16 95 lb 3.2 oz (43.2 kg)  11/21/15 101 lb 9.6 oz (46.1 kg)  11/18/14 94 lb 6.4 oz (42.8  kg)     General Appearance:   Alert, cooperative, no distress, appears stated age  Head:   Normocephalic, without obvious abnormality, atraumatic  Eyes:   PERRL, conjunctiva/corneas clear, EOM's intact, fundi benign  Ears:   Normal TM's and external ear canals  Nose:  Nares normal, mucosa normal, no drainage or sinustenderness  Throat:  Lips, mucosa, and tongue normal; teeth and gums normal  Neck:  Supple, no lymphadenopathy; thyroid: no enlargement/tenderness/nodules; no carotid bruit or JVD  Back:  Spine nontender, no curvature, ROM normal, no CVA tenderness  Lungs:   Clear to auscultation bilaterally without wheezes, rales or ronchi; respirations unlabored  Chest Wall:   No tenderness or deformity  Heart:   Regular rate and rhythm, S1 and S2 normal, no murmur, rub or gallop  Breast Exam:   No tenderness, masses, or nipple discharge or inversion. Fibrocystic changes noted bilaterally, diffusely tender, no discrete mass. No axillary lymphadenopathy  Abdomen:   Soft, non-tender, nondistended, normoactive bowel sounds, no masses, no hepatosplenomegaly  Genitalia:   Normal external genitalia without lesions. BUS and vagina normal; cervix without lesions or cervical motion tenderness. No abnormal vaginal discharge.She is on her cycle, and significant amount of blood is in the vault.  Uterus and adnexa not enlarged, nontender, no masses. Pap performed  Rectal:   Not performed due to age<40 and no related complaints  Extremities:  No clubbing, cyanosis or edema  Pulses:  2+ and symmetric all extremities  Skin:  Skin color, texture, turgor normal, no rashes or lesions  Lymph nodes:  Cervical, supraclavicular, and axillary nodes normal  Neurologic:  CNII-XII intact, normal strength, sensation and gait; reflexes 2+ and symmetric throughout   Psych: Normal mood, affect, hygiene and grooming  Urine  dip--ketones and blood noted.  She reports she started her menses today.  ASSESSMENT/PLAN:  Annual physical exam - Plan: POCT Urinalysis DIP (Proadvantage Device), Visual acuity screening, Cytology - PAP [place], CBC with Differential/Platelet, VITAMIN D 25 Hydroxy (Vit-D Deficiency, Fractures)  Vitamin D deficiency - discussed need for regular supplementation--consider change to D3 2000 IU (more forgiving if forgotten, easier to swallow) - Plan: VITAMIN D 25 Hydroxy (Vit-D Deficiency, Fractures)  Immunization due - Plan: Td : Tetanus/diphtheria >7yo Preservative  free, CANCELED: Tdap vaccine greater than or equal to 7yo IM   Td Pap (and chlamydia screen) Vit D-OH, CBC  Discussed monthly self breast exams and yearly mammograms after the age of 25; at least 30 minutes of aerobic activity at least 5 days/week, weight-bearing exercise at least 2x/week; proper sunscreen use reviewed; healthy diet, including goals of calcium and vitamin D intake and alcohol recommendations (less than or equal to 1 drink/day) reviewed; regular seatbelt use; changing batteries in smoke detectors. Immunization recommendations discussed--Td given. Continue yearly flu shots   F/u 1 year, sooner prn

## 2016-11-19 ENCOUNTER — Other Ambulatory Visit (HOSPITAL_COMMUNITY)
Admission: RE | Admit: 2016-11-19 | Discharge: 2016-11-19 | Disposition: A | Discharge status: Home / Self Care | Payer: BLUE CROSS/BLUE SHIELD | Source: Ambulatory Visit | Attending: Family Medicine | Admitting: Family Medicine

## 2016-11-19 ENCOUNTER — Encounter: Payer: Self-pay | Admitting: Family Medicine

## 2016-11-19 ENCOUNTER — Ambulatory Visit (INDEPENDENT_AMBULATORY_CARE_PROVIDER_SITE_OTHER): Payer: BLUE CROSS/BLUE SHIELD | Admitting: Family Medicine

## 2016-11-19 VITALS — BP 124/80 | HR 80 | Ht 60.5 in | Wt 95.2 lb

## 2016-11-19 DIAGNOSIS — E559 Vitamin D deficiency, unspecified: Secondary | ICD-10-CM

## 2016-11-19 DIAGNOSIS — Z23 Encounter for immunization: Secondary | ICD-10-CM | POA: Diagnosis not present

## 2016-11-19 DIAGNOSIS — Z0000 Encounter for general adult medical examination without abnormal findings: Secondary | ICD-10-CM

## 2016-11-19 LAB — CBC WITH DIFFERENTIAL/PLATELET
BASOS PCT: 0 %
Basophils Absolute: 0 cells/uL (ref 0–200)
EOS ABS: 85 {cells}/uL (ref 15–500)
Eosinophils Relative: 1 %
HCT: 42.8 % (ref 35.0–45.0)
Hemoglobin: 14.1 g/dL (ref 11.7–15.5)
LYMPHS PCT: 18 %
Lymphs Abs: 1530 cells/uL (ref 850–3900)
MCH: 28 pg (ref 27.0–33.0)
MCHC: 32.9 g/dL (ref 32.0–36.0)
MCV: 84.9 fL (ref 80.0–100.0)
MONO ABS: 595 {cells}/uL (ref 200–950)
MONOS PCT: 7 %
MPV: 11.2 fL (ref 7.5–12.5)
Neutro Abs: 6290 cells/uL (ref 1500–7800)
Neutrophils Relative %: 74 %
PLATELETS: 249 10*3/uL (ref 140–400)
RBC: 5.04 MIL/uL (ref 3.80–5.10)
RDW: 15.5 % — AB (ref 11.0–15.0)
WBC: 8.5 10*3/uL (ref 4.0–10.5)

## 2016-11-19 LAB — POCT URINALYSIS DIP (PROADVANTAGE DEVICE)
BILIRUBIN UA: NEGATIVE
GLUCOSE UA: NEGATIVE mg/dL
LEUKOCYTES UA: NEGATIVE
NITRITE UA: NEGATIVE
PH UA: 5 (ref 5.0–8.0)
Protein Ur, POC: NEGATIVE mg/dL
Specific Gravity, Urine: 1.03
UUROB: NEGATIVE

## 2016-11-19 NOTE — Patient Instructions (Signed)

## 2016-11-20 LAB — VITAMIN D 25 HYDROXY (VIT D DEFICIENCY, FRACTURES): Vit D, 25-Hydroxy: 18 ng/mL — ABNORMAL LOW (ref 30–100)

## 2016-11-20 MED ORDER — ERGOCALCIFEROL 1.25 MG (50000 UT) PO CAPS: 50000.0000 [IU] | ORAL_CAPSULE | ORAL | 0 refills | Status: DC

## 2016-11-20 NOTE — Addendum Note (Signed)
Addended by: Joselyn ArrowKNAPP, Viraaj Vorndran on: 11/20/2016 09:28 AM   Modules accepted: Orders

## 2016-11-21 LAB — CYTOLOGY - PAP
CHLAMYDIA, DNA PROBE: NEGATIVE
DIAGNOSIS: NEGATIVE

## 2017-02-11 ENCOUNTER — Other Ambulatory Visit: Payer: Self-pay | Admitting: Family Medicine

## 2017-02-11 DIAGNOSIS — E559 Vitamin D deficiency, unspecified: Secondary | ICD-10-CM

## 2017-02-25 ENCOUNTER — Other Ambulatory Visit: Payer: Self-pay | Admitting: Family Medicine

## 2017-02-25 DIAGNOSIS — E559 Vitamin D deficiency, unspecified: Secondary | ICD-10-CM

## 2017-06-21 ENCOUNTER — Ambulatory Visit (INDEPENDENT_AMBULATORY_CARE_PROVIDER_SITE_OTHER): Payer: BC Managed Care – PPO | Admitting: Family Medicine

## 2017-06-21 ENCOUNTER — Encounter: Payer: Self-pay | Admitting: Family Medicine

## 2017-06-21 VITALS — BP 110/76 | HR 67 | Wt 101.4 lb

## 2017-06-21 DIAGNOSIS — R350 Frequency of micturition: Secondary | ICD-10-CM | POA: Diagnosis not present

## 2017-06-21 DIAGNOSIS — N3000 Acute cystitis without hematuria: Secondary | ICD-10-CM

## 2017-06-21 LAB — POCT URINALYSIS DIP (PROADVANTAGE DEVICE)
BILIRUBIN UA: NEGATIVE
Glucose, UA: NEGATIVE mg/dL
Ketones, POC UA: NEGATIVE mg/dL
NITRITE UA: NEGATIVE
PH UA: 6 (ref 5.0–8.0)
Protein Ur, POC: NEGATIVE mg/dL
RBC UA: NEGATIVE
SPECIFIC GRAVITY, URINE: 1.01
Urobilinogen, Ur: 3.5

## 2017-06-21 MED ORDER — SULFAMETHOXAZOLE-TRIMETHOPRIM 800-160 MG PO TABS: 1.0000 | ORAL_TABLET | Freq: Two times a day (BID) | ORAL | 0 refills | Status: DC

## 2017-06-21 NOTE — Progress Notes (Signed)
Chief Complaint  Patient presents with  . other    possible uti, started around this past sunday. pain when urinating . feels like i have to go all the time per pt.    Subjective:  Carolyn Juarez is a 27 y.o. female who complains of possible urinary tract infection.  She has had symptoms for 5 days.  Symptoms include dysuria . Patient denies fever, chills, nausea, vomiting, back pain, abdominal pain.  Last UTI was last year.   Using nothing for current symptoms. Denies being sexually active. No vaginal discharge.   Patient does not have a history of recurrent UTI. Patient does not have a history of pyelonephritis.  No other aggravating or relieving factors.  No other c/o.  Past Medical History:  Diagnosis Date  . Varicella    as a child; had + titers checked elsewhere    ROS as in subjective  Reviewed allergies, medications, past medical, surgical, and social history.    Objective: Vitals:   06/21/17 1521  BP: 110/76  Pulse: 67  SpO2: 99%    General appearance: alert, no distress, WD/WN, female Abdomen: +bs, soft, non tender, non distended, no masses, no hepatomegaly, no splenomegaly, no bruits Back: no CVA tenderness GU: declines      Laboratory:  Urine dipstick: trace for leukocyte esterase.      Assessment: Acute cystitis without hematuria - Plan: sulfamethoxazole-trimethoprim (BACTRIM DS,SEPTRA DS) 800-160 MG tablet  Urine frequency - Plan: POCT Urinalysis DIP (Proadvantage Device)    Plan: Discussed symptoms, diagnosis, possible complications, and usual course of illness.  Bactrim prescribed.   Advised increased water intake, can use OTC Tylenol for pain.    Advised she may use AZO for 1-2 days and then stop.    Urine culture not sent.     Call or return if worse or not improving.

## 2017-11-23 NOTE — Progress Notes (Signed)
Chief Complaint  Patient presents with  . Annual Exam    fasting annual exam with pap. UA showed trace blood, no symptoms. No concerns.     Carolyn Juarez is a 27 y.o. female who presents for a complete physical.   She saw Carolyn Juarez with a UTI in January. She was treated with Bactrim. Symptoms completely resolved.  Vitamin D deficiency.  Level was 18 last year. She was treated with 12 weeks of prescription therapy, then asked to start 2000 IU daily. She has been taking1000 IU daily, (though admits sometimes she only gets it 3x/week) since completing the prescription. Sporadic with MVI, usually takes one or the other, not both together daily. Energy is good.    Immunization History  Administered Date(s) Administered  . DTaP 05/10/1992, 07/11/1992, 09/30/1992, 06/23/1993, 04/20/1996  . HPV Quadrivalent 09/16/2006, 12/25/2006, 05/07/2007  . Hepatitis A, Adult 11/18/2014, 11/21/2015  . Hepatitis B 04/08/1992, 05/10/1992, 10/17/1992  . HiB (PRP-OMP) 05/10/1992, 09/30/1992, 06/23/1993  . IPV 05/10/1992, 07/11/1992, 09/30/1992, 04/20/1996  . MMR 06/23/1993, 04/20/1996  . Meningococcal B, OMV 11/18/2014, 12/23/2014  . Meningococcal Conjugate 09/16/2006, 11/18/2014  . Td 11/19/2016  . Tdap 09/16/2006   Got flu shot at [facility] this past winter. Last Pap smear: 11/2016, normal; negative chlamydia. Normal in 2016, 2017, 2018. Uses condoms.  Same partner as last year, but took a break in relatioinship; not aware of other sexual partners during that time. Last mammogram: never Last colonoscopy: never Last DEXA: never Dentist: twice yearly Ophtho: never  Exercise: Walks 4 miles 4x/week with her mother. 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  Sometimes walks with backpack. Eats yogurt, cheese, puts milk in her cereal. Lipids: Lab Results  Component Value Date   CHOL 161 11/18/2014   HDL 77 11/18/2014   LDLCALC 68 11/18/2014   TRIG 78 11/18/2014   CHOLHDL 2.1 11/18/2014     Past Medical History:  Diagnosis Date  . Varicella    as a child; had + titers checked elsewhere    History reviewed. No pertinent surgical history.  Social History   Socioeconomic History  . Marital status: Single    Spouse name: Not on file  . Number of children: Not on file  . Years of education: Not on file  . Highest education level: Not on file  Occupational History  . Occupation: Caregiver  Social Needs  . Financial resource strain: Not on file  . Food insecurity:    Worry: Not on file    Inability: Not on file  . Transportation needs:    Medical: Not on file    Non-medical: Not on file  Tobacco Use  . Smoking status: Never Smoker  . Smokeless tobacco: Never Used  Substance and Sexual Activity  . Alcohol use: Yes    Alcohol/week: 0.0 oz    Comment: occasional glass of wine  . Drug use: No  . Sexual activity: Not Currently    Partners: Male    Birth control/protection: Condom  Lifestyle  . Physical activity:    Days per week: Not on file    Minutes per session: Not on file  . Stress: Not on file  Relationships  . Social connections:    Talks on phone: Not on file    Gets together: Not on file    Attends religious service: Not on file    Active member of club or organization: Not on file    Attends meetings of clubs or organizations: Not on  file    Relationship status: Not on file  . Intimate partner violence:    Fear of current or ex partner: Not on file    Emotionally abused: Not on file    Physically abused: Not on file    Forced sexual activity: Not on file  Other Topics Concern  . Not on file  Social History Narrative   Graduated from [employer] in 05/2015; lives with parents.    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   Has 2 large dogs (husky/German Shepherd mix)    Family History  Problem Relation Age of Onset  . Hypertension Mother   . Hyperlipidemia Father   . Cancer Maternal Grandmother        breast  . Breast cancer Maternal  Grandmother 93  . Diabetes Maternal Grandfather   . Diabetes Paternal Grandfather   . Heart disease Neg Hx   . Stroke Neg Hx     Outpatient Encounter Medications as of 11/25/2017  Medication Sig Note  . Cholecalciferol (VITAMIN D3) 1000 units CAPS Take 1 capsule by mouth daily. 11/25/2017: Takes about 3 times a week  . Multiple Vitamin (MULTIVITAMIN) tablet Take 1 tablet by mouth daily. 11/25/2017: Takes sporadically, not usually taking both D and the MVI together.  . [DISCONTINUED] ergocalciferol (VITAMIN D2) 50000 units capsule Take 1 capsule (50,000 Units total) by mouth once a week.   . [DISCONTINUED] Multiple Vitamins-Minerals (HAIR SKIN AND NAILS FORMULA) TABS Take 1 tablet by mouth daily.   . [DISCONTINUED] sulfamethoxazole-trimethoprim (BACTRIM DS,SEPTRA DS) 800-160 MG tablet Take 1 tablet by mouth 2 (two) times daily.    No facility-administered encounter medications on file as of 11/25/2017.     No Known Allergies   ROS: The patient denies anorexia, fever, weight changes, headaches, vision changes, decreased hearing, ear pain, sore throat, breast concerns, chest pain, palpitations, dizziness, syncope, dyspnea on exertion, cough, swelling, nausea, vomiting, diarrhea, constipation, abdominal pain, melena, hematochezia, indigestion/heartburn, hematuria, incontinence, dysuria, irregular menstrual cycles, vaginal discharge, odor or itch, genital lesions, joint pains, numbness, tingling, weakness, tremor, suspicious skin lesions, depression, anxiety, abnormal bleeding/bruising, or enlarged lymph nodes.   PHYSICAL EXAM:  BP 100/62   Pulse 68   Ht '5\' 1"'  (1.549 m)   Wt 105 lb 6.4 oz (47.8 kg)   LMP 11/13/2017 (Exact Date)   BMI 19.92 kg/m   Wt Readings from Last 3 Encounters:  11/25/17 105 lb 6.4 oz (47.8 kg)  06/21/17 101 lb 6.4 oz (46 kg)  11/19/16 95 lb 3.2 oz (43.2 kg)     General Appearance:   Alert, cooperative, no distress, appears stated age  Head:    Normocephalic, without obvious abnormality, atraumatic  Eyes:   PERRL, conjunctiva/corneas clear, EOM's intact, fundi benign  Ears:   Normal TM's and external ear canals  Nose:  Nares normal, mucosa is mildly edematous with clear mucus; no erythema, purulence or sinustenderness  Throat:  Lips, mucosa, and tongue normal; teeth and gums normal  Neck:  Supple, no lymphadenopathy; thyroid: no enlargement/tenderness/nodules; no carotid bruit or JVD  Back:  Spine nontender, no CVA tenderness. Scoliosis noted.  No muscle spasm or tenderness  Lungs:   Clear to auscultation bilaterally without wheezes, rales or ronchi; respirations unlabored  Chest Wall:   No tenderness or deformity  Heart:   Regular rate and rhythm, S1 and S2 normal, no murmur, rub or gallop  Breast Exam:   No tenderness, masses, or nipple discharge or inversion. No axillary lymphadenopathy  Abdomen:   Soft, non-tender, nondistended, normoactive bowel sounds, no masses, no hepatosplenomegaly  Genitalia:   Normal external genitalia without lesions. BUS and vagina normal; no cervical motion tenderness. No abnormal vaginal discharge. Uterus and adnexa not enlarged, nontender, no masses. Pap not performed  Rectal:   Not performed due to age<40 and no related complaints  Extremities:  No clubbing, cyanosis or edema  Pulses:  2+ and symmetric all extremities  Skin:  Skin color, texture, turgor normal, no rashes or lesions  Lymph nodes:  Cervical, supraclavicular, and axillary nodes normal  Neurologic:  CNII-XII intact, normal strength, sensation and gait; reflexes 2+ and symmetric throughout   Psych: Normal mood, affect, hygiene and grooming   ASSESSMENT/PLAN:  Annual physical exam - Plan: POCT Urinalysis DIP (Proadvantage Device), Visual acuity screening, VITAMIN D 25 Hydroxy (Vit-D Deficiency, Fractures), Chlamydia trachomatis, DNA, amp  probe  Vitamin D deficiency - encouraged compliance with OTC vitamins/supplements. Check level - Plan: VITAMIN D 25 Hydroxy (Vit-D Deficiency, Fractures)  Other idiopathic scoliosis, unspecified spinal region - discussed keeping core strong. if develops back pain, would benefit from PT   It is recommended to keep your core muscles (back and abdominal muscles) strong in order to prevent back pain from scoliosis.  If you start having problems, let us know; physical therapy can be helpful.   Discussed monthly self breast exams and yearly mammograms after the age of 55; at least 30 minutes of aerobic activity at least 5 days/week, weight-bearing exercise at least 2x/week; proper sunscreen use reviewed; healthy diet, including goals of calcium and vitamin D intake and alcohol recommendations (less than or equal to 1 drink/day) reviewed; regular seatbelt use; changing batteries in smoke detectors. Immunization recommendations discussed--yearly flu shots recommended  F/u 1 year, sooner prn

## 2017-11-25 ENCOUNTER — Encounter: Payer: Self-pay | Admitting: Family Medicine

## 2017-11-25 ENCOUNTER — Encounter: Payer: BLUE CROSS/BLUE SHIELD | Admitting: Medical

## 2017-11-25 ENCOUNTER — Ambulatory Visit (INDEPENDENT_AMBULATORY_CARE_PROVIDER_SITE_OTHER): Payer: BC Managed Care – PPO | Admitting: Family Medicine

## 2017-11-25 VITALS — BP 100/62 | HR 68 | Ht 61.0 in | Wt 105.4 lb

## 2017-11-25 DIAGNOSIS — M4120 Other idiopathic scoliosis, site unspecified: Secondary | ICD-10-CM | POA: Insufficient documentation

## 2017-11-25 DIAGNOSIS — Z0000 Encounter for general adult medical examination without abnormal findings: Secondary | ICD-10-CM | POA: Diagnosis not present

## 2017-11-25 DIAGNOSIS — E559 Vitamin D deficiency, unspecified: Secondary | ICD-10-CM

## 2017-11-25 LAB — POCT URINALYSIS DIP (PROADVANTAGE DEVICE)
Bilirubin, UA: NEGATIVE
Glucose, UA: NEGATIVE mg/dL
Ketones, POC UA: NEGATIVE mg/dL
LEUKOCYTES UA: NEGATIVE
Nitrite, UA: NEGATIVE
PROTEIN UA: NEGATIVE mg/dL
SPECIFIC GRAVITY, URINE: 1.03
UUROB: NEGATIVE
pH, UA: 6 (ref 5.0–8.0)

## 2017-11-25 NOTE — Patient Instructions (Addendum)
HEALTH MAINTENANCE RECOMMENDATIONS:  It is recommended that you get at least 30 minutes of aerobic exercise at least 5 days/week (for weight loss, you may need as much as 60-90 minutes). This can be any activity that gets your heart rate up. This can be divided in 10-15 minute intervals if needed, but try and build up your endurance at least once a week.  Weight bearing exercise is also recommended twice weekly.  Eat a healthy diet with lots of vegetables, fruits and fiber.  "Colorful" foods have a lot of vitamins (ie green vegetables, tomatoes, red peppers, etc).  Limit sweet tea, regular sodas and alcoholic beverages, all of which has a lot of calories and sugar.  Up to 1 alcoholic drink daily may be beneficial for women (unless trying to lose weight, watch sugars).  Drink a lot of water.  Calcium recommendations are 1200-1500 mg daily (1500 mg for postmenopausal women or women without ovaries), and vitamin D 1000 IU daily.  This should be obtained from diet and/or supplements (vitamins), and calcium should not be taken all at once, but in divided doses.  Monthly self breast exams and yearly mammograms for women over the age of 26 is recommended.  Sunscreen of at least SPF 30 should be used on all sun-exposed parts of the skin when outside between the hours of 10 am and 4 pm (not just when at beach or pool, but even with exercise, golf, tennis, and yard work!)  Use a sunscreen that says "broad spectrum" so it covers both UVA and UVB rays, and make sure to reapply every 1-2 hours.  Remember to change the batteries in your smoke detectors when changing your clock times in the spring and fall. I recommend getting a carbon monoxide detector for your home.  Use your seat belt every time you are in a car, and please drive safely and not be distracted with cell phones and texting while driving.   We discussed Plan B in case there is ever an issue with the condom (forgotten/broken, etc) to prevent  pregnancy.  It is recommended to keep your core muscles (back and abdominal muscles) strong in order to prevent back pain from scoliosis.  If you start having problems, let us know; physical therapy can be helpful.   Scoliosis Scoliosis is the name given to a spine that curves sideways.Scoliosis can cause twisting of your shoulders, hips, chest, back, and rib cage. What are the causes? The cause of scoliosis is not always known. It may be caused by a birth defect or by a disease that can cause muscular dysfunction and imbalance, such as cerebral palsy and muscular dystrophy. What increases the risk? Having a disease that causes muscle disease or dysfunction. What are the signs or symptoms? Scoliosis often has no signs or symptoms.If they are present, they may include:  Unequal size of one body side compared to the other (asymmetry).  Visible curvature of the spine.  Pain. The pain may limit physical activity.  Shortness of breath.  Bowel or bladder issues.  How is this diagnosed? A skilled health care provider will perform an evaluation. This will involve:  Taking your history.  Performing a physical examination.  Performing a neurological exam to detect nerve or muscle function loss.  Range of motion studies on the spine.  X-rays.  An MRI may also be obtained. How is this treated? Treatment varies depending on the nature, extent, and severity of the disease. If the curvature is not great, you may  need only observation. A brace may be used to prevent scoliosis from progressing. A brace may also be needed during growth spurts. Physical therapy may be of benefit. Surgery may be required. Follow these instructions at home:  Your health care provider may suggest exercises to strengthen your muscles. Perform them as directed.  Ask your health care provider before participating in any sports.  If you have been prescribed an orthopedic brace, wear it as instructed by your  health care provider. Contact a health care provider if: Your brace causes the skin to become sore (chafe) or is uncomfortable. Get help right away if:  You have back pain that is not relieved by the medicines prescribed by your health care provider.  Your legs feel weak or you lose function in your legs.  You lose some bowel or bladder control. This information is not intended to replace advice given to you by your health care provider. Make sure you discuss any questions you have with your health care provider. Document Released: 05/25/2000 Document Revised: 11/03/2015 Document Reviewed: 12/01/2015 Elsevier Interactive Patient Education  [employer].

## 2017-11-26 LAB — VITAMIN D 25 HYDROXY (VIT D DEFICIENCY, FRACTURES): VIT D 25 HYDROXY: 27.1 ng/mL — AB (ref 30.0–100.0)

## 2017-11-27 LAB — CHLAMYDIA TRACHOMATIS, DNA, AMP PROBE: CHLAMYDIA, DNA PROBE: NEGATIVE

## 2018-02-17 ENCOUNTER — Encounter: Payer: Self-pay | Admitting: Family Medicine

## 2018-02-17 ENCOUNTER — Ambulatory Visit (INDEPENDENT_AMBULATORY_CARE_PROVIDER_SITE_OTHER): Payer: BC Managed Care – PPO | Admitting: Family Medicine

## 2018-02-17 VITALS — BP 120/78 | HR 96 | Temp 98.6°F | Resp 16 | Wt 103.4 lb

## 2018-02-17 DIAGNOSIS — R1111 Vomiting without nausea: Secondary | ICD-10-CM

## 2018-02-17 MED ORDER — ONDANSETRON 4 MG PO TBDP: 4.0000 mg | ORAL_TABLET | Freq: Three times a day (TID) | ORAL | 0 refills | Status: DC | PRN

## 2018-02-17 NOTE — Progress Notes (Signed)
   Subjective:    Patient ID: Carolyn Juarez, female    DOB: 08/19/1990, 27 y.o.   MRN: [id number]  HPI Chief Complaint  Patient presents with  . stomach cramping    somach cramping, vomitting, chills,  started last night   She is here with complaints of chills and vomiting. Symptoms started last night. She reports having 2 episodes of vomiting. Mild abdominal cramping with vomiting but otherwise no pain. States she had a normal bowel movement last night and then some loose stool overnight. No blood in her stool.   She reports having [employer] last night and she ate at a food festival this weekend.  No other members of her family are ill.   Denies possibility of pregnancy.  LMP: 02/06/2018  Denies fever, dizziness, headache, ear pain, sore throat, chest pain, palpitations, shortness of breath, urinary symptoms, vaginal discharge.    Review of Systems Pertinent positives and negatives in the history of present illness.     Objective:   Physical Exam BP 120/78   Pulse 96   Temp 98.6 F (37 C) (Oral)   Resp 16   Wt 103 lb 6.4 oz (46.9 kg)   SpO2 99%   BMI 19.54 kg/m   Alert and in no distress. Tympanic membranes and canals are normal. Pharyngeal area is normal. Neck is supple without adenopathy or thyromegaly. Cardiac exam shows a regular sinus rhythm without murmurs or gallops. Lungs are clear to auscultation.  Abdomen is soft, nondistended, nontender, bowel sounds present, nontender, no rebound or guarding, no palpable masses.  Extremities without edema, pulses intact.  Skin is warm and dry, no pallor or rash.       Assessment & Plan:  Non-intractable vomiting without nausea, unspecified vomiting type - Plan: ondansetron (ZOFRAN ODT) 4 MG disintegrating tablet  Unclear etiology. Vitals WNL and exam is unremarkable. We will do supportive care for now. Zofran ODT prescribed. Discussed eating a bland diet and staying hydrated today. She will keep an eye on her symptoms and  report back any worsening pain, fever, etc.

## 2018-02-17 NOTE — Patient Instructions (Signed)
Eat a bland diet today and stay well hydrated. Take the Zofran if needed for nausea or vomiting.  If you develop high fever, severe abdominal pain that is consistent or worsening symptoms then let me know.

## 2018-08-15 ENCOUNTER — Ambulatory Visit: Payer: BC Managed Care – PPO | Admitting: Family Medicine

## 2018-08-15 ENCOUNTER — Encounter: Payer: Self-pay | Admitting: Family Medicine

## 2018-08-15 VITALS — BP 116/66 | HR 64 | Temp 97.9°F | Wt 105.6 lb

## 2018-08-15 DIAGNOSIS — R300 Dysuria: Secondary | ICD-10-CM

## 2018-08-15 DIAGNOSIS — N3000 Acute cystitis without hematuria: Secondary | ICD-10-CM

## 2018-08-15 LAB — POCT URINALYSIS DIP (PROADVANTAGE DEVICE)
BILIRUBIN UA: NEGATIVE mg/dL
Bilirubin, UA: NEGATIVE
Blood, UA: NEGATIVE
Glucose, UA: NEGATIVE mg/dL
LEUKOCYTES UA: NEGATIVE
Nitrite, UA: NEGATIVE
PH UA: 6 (ref 5.0–8.0)
PROTEIN UA: NEGATIVE mg/dL
SPECIFIC GRAVITY, URINE: 1.01
UUROB: NEGATIVE

## 2018-08-15 MED ORDER — SULFAMETHOXAZOLE-TRIMETHOPRIM 800-160 MG PO TABS: 1.0000 | ORAL_TABLET | Freq: Two times a day (BID) | ORAL | 0 refills | Status: DC

## 2018-08-15 NOTE — Progress Notes (Signed)
Chief Complaint  Patient presents with  . UTI    UTI- burning with urination, urgency, started wednesday    Subjective:  Carolyn Juarez is a 28 y.o. female who complains of possible urinary tract infection.  She has had symptoms for 2 days.  Symptoms include urinary urgency, frequency, and dysuria. Patient denies fever, chills, abdominal pain, N/V/D, vaginal discharge.  Last UTI was last year.    Using nothing for current symptoms.    Patient does not have a history of recurrent UTI. Patient does not have a history of pyelonephritis.  No other aggravating or relieving factors.  No other c/o.  LMP: 08/06/2018  Past Medical History:  Diagnosis Date  . Varicella    as a child; had + titers checked elsewhere    ROS as in subjective  Reviewed allergies, medications, past medical, surgical, and social history.    Objective: Vitals:   08/15/18 1552  BP: 116/66  Pulse: 64  Temp: 97.9 F (36.6 C)    General appearance: alert, no distress, WD/WN, female Abdomen: +bs, soft, non tender, non distended, no masses, no hepatomegaly, no splenomegaly, no bruits Back: no CVA tenderness GU: declines      Laboratory:  Urine dipstick: negative for all components.       Assessment: Acute cystitis without hematuria - Plan: sulfamethoxazole-trimethoprim (BACTRIM DS,SEPTRA DS) 800-160 MG tablet  Burning with urination - Plan: POCT Urinalysis DIP (Proadvantage Device)    Plan: Discussed symptoms, diagnosis, possible complications, and usual course of illness.  Bactrim prescribed. Advised increased water intake, can use OTC Tylenol for pain.   Urine culture was not sent.   Call or return if worse or not improving.

## 2018-08-15 NOTE — Patient Instructions (Signed)
Urinary Tract Infection, Adult A urinary tract infection (UTI) is an infection of any part of the urinary tract. The urinary tract includes:  The kidneys.  The ureters.  The bladder.  The urethra. These organs make, store, and get rid of pee (urine) in the body. What are the causes? This is caused by germs (bacteria) in your genital area. These germs grow and cause swelling (inflammation) of your urinary tract. What increases the risk? You are more likely to develop this condition if:  You have a small, thin tube (catheter) to drain pee.  You cannot control when you pee or poop (incontinence).  You are female, and: ? You use these methods to prevent pregnancy: ? A medicine that kills sperm (spermicide). ? A device that blocks sperm (diaphragm). ? You have low levels of a female hormone (estrogen). ? You are pregnant.  You have genes that add to your risk.  You are sexually active.  You take antibiotic medicines.  You have trouble peeing because of: ? A prostate that is bigger than normal, if you are female. ? A blockage in the part of your body that drains pee from the bladder (urethra). ? A kidney stone. ? A nerve condition that affects your bladder (neurogenic bladder). ? Not getting enough to drink. ? Not peeing often enough.  You have other conditions, such as: ? Diabetes. ? A weak disease-fighting system (immune system). ? Sickle cell disease. ? Gout. ? Injury of the spine. What are the signs or symptoms? Symptoms of this condition include:  Needing to pee right away (urgently).  Peeing often.  Peeing small amounts often.  Pain or burning when peeing.  Blood in the pee.  Pee that smells bad or not like normal.  Trouble peeing.  Pee that is cloudy.  Fluid coming from the vagina, if you are female.  Pain in the belly or lower back. Other symptoms include:  Throwing up (vomiting).  No urge to eat.  Feeling mixed up (confused).  Being tired  and grouchy (irritable).  A fever.  Watery poop (diarrhea). How is this treated? This condition may be treated with:  Antibiotic medicine.  Other medicines.  Drinking enough water. Follow these instructions at home:  Medicines  Take over-the-counter and prescription medicines only as told by your doctor.  If you were prescribed an antibiotic medicine, take it as told by your doctor. Do not stop taking it even if you start to feel better. General instructions  Make sure you: ? Pee until your bladder is empty. ? Do not hold pee for a long time. ? Empty your bladder after sex. ? Wipe from front to back after pooping if you are a female. Use each tissue one time when you wipe.  Drink enough fluid to keep your pee pale yellow.  Keep all follow-up visits as told by your doctor. This is important. Contact a doctor if:  You do not get better after 1-2 days.  Your symptoms go away and then come back. Get help right away if:  You have very bad back pain.  You have very bad pain in your lower belly.  You have a fever.  You are sick to your stomach (nauseous).  You are throwing up. Summary  A urinary tract infection (UTI) is an infection of any part of the urinary tract.  This condition is caused by germs in your genital area.  There are many risk factors for a UTI. These include having a small, thin   tube to drain pee and not being able to control when you pee or poop.  Treatment includes antibiotic medicines for germs.  Drink enough fluid to keep your pee pale yellow. This information is not intended to replace advice given to you by your health care provider. Make sure you discuss any questions you have with your health care provider. Document Released: 11/14/2007 Document Revised: 12/05/2017 Document Reviewed: 12/05/2017 Elsevier Interactive Patient Education  2019 [employer]  

## 2018-11-25 NOTE — Progress Notes (Signed)
Chief Complaint  Patient presents with  . Annual Exam    fasting annual exam with pap. No concerns.     Carolyn Juarez is a 28 y.o. female who presents for a complete physical.  She has the following concerns:  She saw Vickie with a UTI in March. She was treated with Bactrim. Symptoms completely resolved.  She gets 1 UTI/year.  Vitamin D deficiency. Last level was 27.1 in 11/2017 (had been 18 in 11/2016). At that time she admitted to taking 1000 IU, but forgetting frequently, sometimes only getting 3x/week. She was also sporadic with her MVI, taking one or the other, not both. She currently hasn't been taking any regular vitamins, just very sporadically.   Immunization History  Administered Date(s) Administered  . DTaP 05/10/1992, 07/11/1992, 09/30/1992, 06/23/1993, 04/20/1996  . HPV Quadrivalent 09/16/2006, 12/25/2006, 05/07/2007  . Hepatitis A, Adult 11/18/2014, 11/21/2015  . Hepatitis B 04/08/1992, 05/10/1992, 10/17/1992  . HiB (PRP-OMP) 05/10/1992, 09/30/1992, 06/23/1993  . IPV 05/10/1992, 07/11/1992, 09/30/1992, 04/20/1996  . MMR 06/23/1993, 04/20/1996  . Meningococcal B, OMV 11/18/2014, 12/23/2014  . Meningococcal Conjugate 09/16/2006, 11/18/2014  . Td 11/19/2016  . Tdap 09/16/2006   Gets flu shots yearly at the pharmacy Last Pap smear: 11/2016, normal. Normal in 2016, 2017, 2018. Negative chlamydia screen 11/2017. Uses condoms.  Same partner as last year, monogamous. Needed use Plan B once in January. Last mammogram: never Last colonoscopy: never Last DEXA: never Dentist: twice yearly Ophtho: never  Exercise: Walks 4 miles 4x/week with her mother (weather permitting). Rides bikes with her brother. Wears backpack while walking 2x/week (weight-bearing).  Eats yogurt, cheese, puts milk in her cereal. Lipids: Lab Results  Component Value Date   CHOL 161 11/18/2014   HDL 77 11/18/2014   LDLCALC 68 11/18/2014   TRIG 78 11/18/2014   CHOLHDL 2.1 11/18/2014   Past Medical  History:  Diagnosis Date  . Varicella    as a child; had + titers checked elsewhere    History reviewed. No pertinent surgical history.  Social History   Socioeconomic History  . Marital status: Single    Spouse name: Not on file  . Number of children: Not on file  . Years of education: Not on file  . Highest education level: Not on file  Occupational History  . Occupation: Caregiver  Social Needs  . Financial resource strain: Not on file  . Food insecurity    Worry: Not on file    Inability: Not on file  . Transportation needs    Medical: Not on file    Non-medical: Not on file  Tobacco Use  . Smoking status: Never Smoker  . Smokeless tobacco: Never Used  Substance and Sexual Activity  . Alcohol use: Yes    Alcohol/week: 0.0 standard drinks    Comment: occasional glass of wine (rare)  . Drug use: No  . Sexual activity: Yes    Partners: Male    Birth control/protection: Condom  Lifestyle  . Physical activity    Days per week: Not on file    Minutes per session: Not on file  . Stress: Not on file  Relationships  . Social Herbalist on phone: Not on file    Gets together: Not on file    Attends religious service: Not on file    Active member of club or organization: Not on file    Attends meetings of clubs or organizations: Not on file    Relationship status: Not on file  .  Intimate partner violence    Fear of current or ex partner: Not on file    Emotionally abused: Not on file    Physically abused: Not on file    Forced sexual activity: Not on file  Other Topics Concern  . Not on file  Social History Narrative   Graduated from [employer] in 05/2015.  Moved out, lives with her brother (he bought a house), looking to move out on her own.    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   Works homecare with elderly over the summer.   Has 2 large dogs (husky/German Shepherd mix) at parents house    Family History  Problem Relation Age of Onset  .  Hypertension Mother   . Hyperlipidemia Father   . Cancer Maternal Grandmother        breast  . Breast cancer Maternal Grandmother 48  . Diabetes Maternal Grandfather   . Diabetes Paternal Grandfather   . Heart disease Neg Hx   . Stroke Neg Hx     Outpatient Encounter Medications as of 11/27/2018  Medication Sig Note  . [DISCONTINUED] Cholecalciferol (VITAMIN D3) 1000 units CAPS Take 1 capsule by mouth daily. 11/25/2017: Takes about 3 times a week  . [DISCONTINUED] Multiple Vitamin (MULTIVITAMIN) tablet Take 1 tablet by mouth daily. 11/25/2017: Takes sporadically, not usually taking both D and the MVI together.  . [DISCONTINUED] sulfamethoxazole-trimethoprim (BACTRIM DS,SEPTRA DS) 800-160 MG tablet Take 1 tablet by mouth 2 (two) times daily.    No facility-administered encounter medications on file as of 11/27/2018.     No Known Allergies   ROS: The patient denies anorexia, fever, weight changes, headaches, vision changes, decreased hearing, ear pain, sore throat, breast concerns, chest pain, palpitations, dizziness, syncope, dyspnea on exertion, cough, swelling, nausea, vomiting, diarrhea, constipation, abdominal pain, melena, hematochezia, indigestion/heartburn, hematuria, incontinence, dysuria, irregular menstrual cycles, vaginal discharge, odor or itch, genital lesions, joint pains, numbness, tingling, weakness, tremor, suspicious skin lesions, depression, anxiety, abnormal bleeding/bruising, or enlarged lymph nodes. Cut back on fast foods, more home cooked meals since the pandemic. Heat rash on her neck and arms from walking--itchy. Denies any back pain, known scoliosis.   PHYSICAL EXAM:  BP 118/72   Pulse 84   Temp 98.7 F (37.1 C) (Temporal)   Ht '5\' 1"'  (1.549 m)   Wt 100 lb 6.4 oz (45.5 kg)   LMP 11/23/2018 (Exact Date)   BMI 18.97 kg/m   Wt Readings from Last 3 Encounters:  11/27/18 100 lb 6.4 oz (45.5 kg)  08/15/18 105 lb 9.6 oz (47.9 kg)  02/17/18 103 lb 6.4 oz  (46.9 kg)    General Appearance:   Alert, cooperative, no distress, appears stated age  Head:   Normocephalic, without obvious abnormality, atraumatic  Eyes:   PERRL, conjunctiva/corneas clear, EOM's intact, fundi benign  Ears:   Normal TM's and external ear canals  Nose:   Not examined (wearing mask due to COVID-19 pandemic)  Throat:   Not examined (wearing mask due to COVID-19 pandemic)    Neck:  Supple, no lymphadenopathy; thyroid: noenlargement/ tenderness/nodules; no carotid bruit or JVD  Back:  Spine nontender, no CVA tenderness. Scoliosis noted.  No muscle spasm or tenderness  Lungs:   Clear to auscultation bilaterally without wheezes, rales or ronchi; respirations unlabored  Chest Wall:   No tenderness or deformity  Heart:   Regular rate and rhythm, S1 and S2 normal, no murmur, rub or gallop  Breast Exam:   No tenderness,  masses, or nipple discharge or inversion. No axillary lymphadenopathy  Abdomen:   Soft, non-tender, nondistended, normoactive bowel sounds, no masses, no hepatosplenomegaly. Just slightly firmer at R mid abdomen, no masses, nontender.  Genitalia:   Normal external genitalia without lesions. BUS and vagina normal; no cervical motion tenderness. No abnormal vaginal discharge, had some light vaginal bleeding (at end of cycle). Uterus and adnexa not enlarged, nontender, no masses. Pap not performed  Rectal:   Not performed due to age<40 and no related complaints  Extremities:  No clubbing, cyanosis or edema  Pulses:  2+ and symmetric all extremities  Skin:  Skin color, texture, turgor normal, no lesions. There are a few excoriated papules at the R antecubital fossa.  No soft tissue swelling, erythema, warmth or crusting (healing, hyperpigmented)  Lymph nodes:  Cervical, supraclavicular, and axillary nodes normal  Neurologic:  CNII-XII intact, normal strength, sensation and gait; reflexes 2+ and symmetric  throughout   Psych: Normal mood, affect, hygiene and grooming   ASSESSMENT/PLAN:   Annual physical exam - Plan: VITAMIN D 25 Hydroxy (Vit-D Deficiency, Fractures), CBC with Differential/Platelet  Vitamin D deficiency - discussed need for daily supplementation, as well as discussed Ca recommendations - Plan: VITAMIN D 25 Hydroxy (Vit-D Deficiency, Fractures)  Other idiopathic scoliosis, unspecified spinal region - asymptomatic; encouraged regular exercises to void problems in future   Declines other contraceptive options, to continue condoms (with Plan B as back-up)   Discussed monthly self breast exams and yearly mammograms after the age of 13; at least 30 minutes of aerobic activity at least 5 days/week, weight-bearing exercise at least 2x/week; proper sunscreen use reviewed; healthy diet, including goals of calcium and vitamin D intake and alcohol recommendations (less than or equal to 1 drink/day) reviewed; regular seatbelt use; changing batteries in smoke detectors. Immunization recommendations discussed--continue yearly flu shots  F/u 1 year, sooner prn

## 2018-11-25 NOTE — Patient Instructions (Signed)
  HEALTH MAINTENANCE RECOMMENDATIONS:  It is recommended that you get at least 30 minutes of aerobic exercise at least 5 days/week (for weight loss, you may need as much as 60-90 minutes). This can be any activity that gets your heart rate up. This can be divided in 10-15 minute intervals if needed, but try and build up your endurance at least once a week.  Weight bearing exercise is also recommended twice weekly.  Eat a healthy diet with lots of vegetables, fruits and fiber.  "Colorful" foods have a lot of vitamins (ie green vegetables, tomatoes, red peppers, etc).  Limit sweet tea, regular sodas and alcoholic beverages, all of which has a lot of calories and sugar.  Up to 1 alcoholic drink daily may be beneficial for women (unless trying to lose weight, watch sugars).  Drink a lot of water.  Calcium recommendations are 1200-1500 mg daily (1500 mg for postmenopausal women or women without ovaries), and vitamin D 1000 IU daily.  This should be obtained from diet and/or supplements (vitamins), and calcium should not be taken all at once, but in divided doses.  Monthly self breast exams and yearly mammograms for women over the age of 52 is recommended.  Sunscreen of at least SPF 30 should be used on all sun-exposed parts of the skin when outside between the hours of 10 am and 4 pm (not just when at beach or pool, but even with exercise, golf, tennis, and yard work!)  Use a sunscreen that says "broad spectrum" so it covers both UVA and UVB rays, and make sure to reapply every 1-2 hours.  Remember to change the batteries in your smoke detectors when changing your clock times in the spring and fall.  Use your seat belt every time you are in a car, and please drive safely and not be distracted with cell phones and texting while driving.  It is recommended to keep your core muscles (back and abdominal muscles) strong in order to prevent back pain from scoliosis.  If you start having problems, let us know;  physical therapy can be helpful.

## 2018-11-27 ENCOUNTER — Encounter: Payer: Self-pay | Admitting: Family Medicine

## 2018-11-27 ENCOUNTER — Other Ambulatory Visit: Payer: Self-pay

## 2018-11-27 ENCOUNTER — Ambulatory Visit (INDEPENDENT_AMBULATORY_CARE_PROVIDER_SITE_OTHER): Payer: BC Managed Care – PPO | Admitting: Family Medicine

## 2018-11-27 VITALS — BP 118/72 | HR 84 | Temp 98.7°F | Ht 61.0 in | Wt 100.4 lb

## 2018-11-27 DIAGNOSIS — Z0000 Encounter for general adult medical examination without abnormal findings: Secondary | ICD-10-CM | POA: Diagnosis not present

## 2018-11-27 DIAGNOSIS — M4120 Other idiopathic scoliosis, site unspecified: Secondary | ICD-10-CM | POA: Diagnosis not present

## 2018-11-27 DIAGNOSIS — E559 Vitamin D deficiency, unspecified: Secondary | ICD-10-CM | POA: Diagnosis not present

## 2018-11-28 LAB — CBC WITH DIFFERENTIAL/PLATELET
Basophils Absolute: 0 10*3/uL (ref 0.0–0.2)
Basos: 0 %
EOS (ABSOLUTE): 0.3 10*3/uL (ref 0.0–0.4)
Eos: 4 %
Hematocrit: 40.9 % (ref 34.0–46.6)
Hemoglobin: 13.8 g/dL (ref 11.1–15.9)
Immature Grans (Abs): 0 10*3/uL (ref 0.0–0.1)
Immature Granulocytes: 0 %
Lymphocytes Absolute: 1.9 10*3/uL (ref 0.7–3.1)
Lymphs: 23 %
MCH: 28.5 pg (ref 26.6–33.0)
MCHC: 33.7 g/dL (ref 31.5–35.7)
MCV: 84 fL (ref 79–97)
Monocytes Absolute: 0.6 10*3/uL (ref 0.1–0.9)
Monocytes: 8 %
Neutrophils Absolute: 5.4 10*3/uL (ref 1.4–7.0)
Neutrophils: 65 %
Platelets: 266 10*3/uL (ref 150–450)
RBC: 4.85 x10E6/uL (ref 3.77–5.28)
RDW: 15.4 % (ref 11.7–15.4)
WBC: 8.3 10*3/uL (ref 3.4–10.8)

## 2018-11-28 LAB — VITAMIN D 25 HYDROXY (VIT D DEFICIENCY, FRACTURES): Vit D, 25-Hydroxy: 27.6 ng/mL — ABNORMAL LOW (ref 30.0–100.0)

## 2019-07-22 ENCOUNTER — Telehealth: Payer: Self-pay | Admitting: *Deleted

## 2019-07-22 DIAGNOSIS — E559 Vitamin D deficiency, unspecified: Secondary | ICD-10-CM

## 2019-07-22 DIAGNOSIS — Z0000 Encounter for general adult medical examination without abnormal findings: Secondary | ICD-10-CM

## 2019-07-22 NOTE — Telephone Encounter (Signed)
Patient rescheduled for 12/23/19 for CPE and would like to come 12/21/19 for fasting labs. Need future orders please.

## 2019-07-24 NOTE — Telephone Encounter (Signed)
done

## 2019-12-03 ENCOUNTER — Encounter: Payer: Self-pay | Admitting: Family Medicine

## 2019-12-21 ENCOUNTER — Other Ambulatory Visit: Payer: Self-pay

## 2019-12-22 NOTE — Patient Instructions (Addendum)
HEALTH MAINTENANCE RECOMMENDATIONS:  It is recommended that you get at least 30 minutes of aerobic exercise at least 5 days/week (for weight loss, you may need as much as 60-90 minutes). This can be any activity that gets your heart rate up. This can be divided in 10-15 minute intervals if needed, but try and build up your endurance at least once a week.  Weight bearing exercise is also recommended twice weekly.  Eat a healthy diet with lots of vegetables, fruits and fiber.  "Colorful" foods have a lot of vitamins (ie green vegetables, tomatoes, red peppers, etc).  Limit sweet tea, regular sodas and alcoholic beverages, all of which has a lot of calories and sugar.  Up to 1 alcoholic drink daily may be beneficial for women (unless trying to lose weight, watch sugars).  Drink a lot of water.  Calcium recommendations are 1200-1500 mg daily (1500 mg for postmenopausal women or women without ovaries), and vitamin D 1000 IU daily.  This should be obtained from diet and/or supplements (vitamins), and calcium should not be taken all at once, but in divided doses.  Monthly self breast exams and yearly mammograms for women over the age of 56 is recommended.  Sunscreen of at least SPF 30 should be used on all sun-exposed parts of the skin when outside between the hours of 10 am and 4 pm (not just when at beach or pool, but even with exercise, golf, tennis, and yard work!)  Use a sunscreen that says "broad spectrum" so it covers both UVA and UVB rays, and make sure to reapply every 1-2 hours.  Remember to change the batteries in your smoke detectors when changing your clock times in the spring and fall. Carbon monoxide detectors are recommended for your home.  Use your seat belt every time you are in a car, and please drive safely and not be distracted with cell phones and texting while driving.  I highly recommend that you get either Pfizer or Moderna COVID vaccine as soon as possible, as we  discussed.   MyPlate from USDA  MyPlate is an outline of a general healthy diet based on the 2010 Dietary Guidelines for Americans, from the U.S. Department of Agriculture Architect). It sets guidelines for how To follow MyPlate recommendations:  Eat a wide variety of fruits and vegetables, grains, and protein foods.  Serve smaller portions and eat less food throughout the day.  Limit portion sizes to avoid overeating.  Enjoy your food.  Get at least 150 minutes of exercise every week. This is about 30 minutes each day, 5 or more days per week. It can be difficult to have every meal look like MyPlate. Think about MyPlate as eating guidelines for an entire day, rather than each individual meal. Fruits and vegetables  Make half of your plate fruits and vegetables.  Eat many different colors of fruits and vegetables each day.  For a 2,000 calorie daily food plan, eat: ? 2 cups of vegetables every day. ? 2 cups of fruit every day.  1 cup is equal to: ? 1 cup raw or cooked vegetables. ? 1 cup raw fruit. ? 1 medium-sized orange, apple, or banana. ? 1 cup 100% fruit or vegetable juice. ? 2 cups raw leafy greens, such as lettuce, spinach, or kale. ?  cup dried fruit. Grains  One fourth of your plate should be grains.  Make at least half of the grains you eat each day whole grains.  For a 2,000 calorie daily  food plan, eat 6 oz of grains every day.  1 oz is equal to: ? 1 slice bread. ? 1 cup cereal. ?  cup cooked rice, cereal, or pasta. Protein  One fourth of your plate should be protein.  Eat a wide variety of protein foods, including meat, poultry, fish, eggs, beans, nuts, and tofu.  For a 2,000 calorie daily food plan, eat 5 oz of protein every day.  1 oz is equal to: ? 1 oz meat, poultry, or fish. ?  cup cooked beans. ? 1 egg. ?  oz nuts or seeds. ? 1 Tbsp peanut butter. Dairy  Drink fat-free or low-fat (1%) milk.  Eat or drink dairy as a side to  meals.  For a 2,000 calorie daily food plan, eat or drink 3 cups of dairy every day.  1 cup is equal to: ? 1 cup milk, yogurt, cottage cheese, or soy milk (soy beverage). ? 2 oz processed cheese. ? 1 oz natural cheese. Fats, oils, salt, and sugars  Only small amounts of oils are recommended.  Avoid foods that are high in calories and low in nutritional value (empty calories), like foods high in fat or added sugars.  Choose foods that are low in salt (sodium). Choose foods that have less than 140 milligrams (mg) of sodium per serving.  Drink water instead of sugary drinks. Drink enough water each day to keep your urine pale yellow. Where to find support  Work with your health care provider or a nutrition specialist (dietitian) to develop a customized eating plan that is right for you.  Download an app (mobile application) to help you track your daily food intake. Where to find more information  Go to https://www.[facility].org/ for more information. Summary  MyPlate is a general guideline for healthy eating from the USDA. It is based on the 2010 Dietary Guidelines for Americans.  In general, fruits and vegetables should take up  of your plate, grains should take up  of your plate, and protein should take up  of your plate. This information is not intended to replace advice given to you by your health care provider. Make sure you discuss any questions you have with your health care provider. Document Revised: 10/30/2018 Document Reviewed: 08/27/2016 Elsevier Patient Education  2020 [employer].

## 2019-12-22 NOTE — Progress Notes (Signed)
Chief Complaint  Patient presents with  . other    CPE, lab done this am, pap needed and no covid vaccine still not sure of getting vaccine , U/A show blood and not on cycle or due to start    Carolyn Juarez is a 29 y.o. female who presents for a complete physical.    She has the following concerns: She gained weight and feels good. She does admit that her diet could be a little better (eats some fast food, likes carbs/starches).  Had been walking regularly until it got too hot over the last couple of weeks.  Vitamin D deficiency. Last level was 27.6 in 11/2018.  She had been very sporadic in taking any vitamins (MVI or D).  The year prior it had been 27.1, when she admitted to taking 1000 IU, but forgetting frequently, sometimes only getting 3x/week.  She is currently taking 2000 IU daily, with only rarely a missed dose.  Immunization History  Administered Date(s) Administered  . DTaP 05/10/1992, 07/11/1992, 09/30/1992, 06/23/1993, 04/20/1996  . HPV Quadrivalent 09/16/2006, 12/25/2006, 05/07/2007  . Hepatitis A, Adult 11/18/2014, 11/21/2015  . Hepatitis B 04/08/1992, 05/10/1992, 10/17/1992  . HiB (PRP-OMP) 05/10/1992, 09/30/1992, 06/23/1993  . IPV 05/10/1992, 07/11/1992, 09/30/1992, 04/20/1996  . Influenza Inj Mdck Quad Pf 02/25/2018  . Influenza,inj,Quad PF,6+ Mos 05/27/2017, 01/30/2019  . MMR 06/23/1993, 04/20/1996  . Meningococcal B, OMV 11/18/2014, 12/23/2014  . Meningococcal Conjugate 09/16/2006, 11/18/2014  . Td 11/19/2016  . Tdap 09/16/2006   Last Pap smear: 11/2016, normal. Normal in 2016, 2017, 2018. Negative chlamydia screen 11/2017. Uses condoms. Same partner as last year, monogamous since 2018. Needed use Plan B once in May (condom came off) Last mammogram: never Last colonoscopy: never Last DEXA: never Dentist: twice yearly Ophtho: never  Exercise: Not walking due to the heat in the last month. Had been walking after work x 45 minutes, at least 4 x/week.  She has a  weighted vest (not using it yet).  Eats yogurt, cheese, puts milk in her cereal. Fast food 4x/week (sometimes for breakfast, sometimes for dinner) Lipids: Lab Results  Component Value Date   CHOL 161 11/18/2014   HDL 77 11/18/2014   LDLCALC 68 11/18/2014   TRIG 78 11/18/2014   CHOLHDL 2.1 11/18/2014   PMH, PSH, SH and FH were reviewed and updated  Outpatient Encounter Medications as of 12/23/2019  Medication Sig  . VITAMIN D PO Take 2,000 Units by mouth.    No facility-administered encounter medications on file as of 12/23/2019.   No Known Allergies  ROS: The patient denies anorexia, fever, weight changes, headaches, vision changes, decreased hearing, ear pain, sore throat, breast concerns, chest pain, palpitations, dizziness, syncope, dyspnea on exertion, cough, swelling, nausea, vomiting, diarrhea, constipation, abdominal pain, melena, hematochezia, indigestion/heartburn, hematuria, incontinence, dysuria, irregular menstrual cycles, vaginal discharge, odor or itch, genital lesions, joint pains, numbness, tingling, weakness, tremor, suspicious skin lesions, depression, anxiety, abnormal bleeding/bruising, or enlarged lymph nodes. Denies any back pain, known scoliosis.  PHYSICAL EXAM:  BP 120/72   Pulse 88   Temp 98 F (36.7 C)   Ht '5\' 1"'  (1.549 m)   Wt 116 lb (52.6 kg)   LMP 12/06/2019   SpO2 98%   BMI 21.92 kg/m    Wt Readings from Last 3 Encounters:  12/23/19 116 lb (52.6 kg)  11/27/18 100 lb 6.4 oz (45.5 kg)  08/15/18 105 lb 9.6 oz (47.9 kg)    General Appearance:   Alert, cooperative, no distress, appears stated  age  Head:   Normocephalic, without obvious abnormality, atraumatic  Eyes:   PERRL, conjunctiva/corneas clear, EOM's intact, fundi benign  Ears:   Normal TM's and external ear canals  Nose:   Not examined (wearing mask due to COVID-19 pandemic)  Throat: Not examined (wearing mask due to COVID-19 pandemic)    Neck:  Supple, no  lymphadenopathy; thyroid: noenlargement/ tenderness/nodules; no carotid bruit or JVD  Back:  Spine nontender, no CVA tenderness. Scoliosis noted in mid-back. No muscle spasm or tenderness  Lungs:   Clear to auscultation bilaterally without wheezes, rales or ronchi; respirations unlabored  Chest Wall:   No tenderness or deformity  Heart:   Regular rate and rhythm, S1 and S2 normal, no murmur, rub or gallop  Breast Exam:   No tenderness, masses, or nipple discharge or inversion. No axillary lymphadenopathy  Abdomen:   Soft, non-tender, nondistended, normoactive bowel sounds, no masses, no hepatosplenomegaly  Genitalia:   Normal external genitalia without lesions. BUS and vagina normal; nocervical lesions, discharge or cervical motion tenderness. No abnormal vaginal discharge. Uterus and adnexa not enlarged, nontender, no masses. Pap performed  Rectal:   Not performed due to age<40 and no related complaints  Extremities:  No clubbing, cyanosis or edema  Pulses:  2+ and symmetric all extremities  Skin:  Skin color, texture, turgor normal, no lesions. Tattoo on back  Lymph nodes:  Cervical, supraclavicular, and axillary nodes normal  Neurologic:  CNII-XII intact, normal strength, sensation and gait; reflexes 2+ and symmetric throughout   Psych:  Normal mood, affect, hygiene and grooming  Urine dip: SG 1.030, trace blood, o/w negative   ASSESSMENT/PLAN:  Annual physical exam - Discussed contraception--prefers to stay with condoms, and using Plan B for failures. Briefly discussed other options. 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 Immunization recommendations discussed--continue yearly flu shots. Counseled in detail re: COVID vaccines, strongly encouraged her to get (for both her health, and due to working with elderly, as well as with young children, unvaccinated who don't keep their masks on).   F/u 1 year, sooner prn

## 2019-12-23 ENCOUNTER — Other Ambulatory Visit: Payer: Self-pay

## 2019-12-23 ENCOUNTER — Other Ambulatory Visit (HOSPITAL_COMMUNITY)
Admission: RE | Admit: 2019-12-23 | Discharge: 2019-12-23 | Disposition: A | Discharge status: Home / Self Care | Payer: BC Managed Care – PPO | Source: Ambulatory Visit | Attending: Family Medicine | Admitting: Family Medicine

## 2019-12-23 ENCOUNTER — Ambulatory Visit: Payer: BC Managed Care – PPO | Admitting: Family Medicine

## 2019-12-23 ENCOUNTER — Encounter: Payer: Self-pay | Admitting: Family Medicine

## 2019-12-23 VITALS — BP 120/72 | HR 88 | Temp 98.0°F | Ht 61.0 in | Wt 116.0 lb

## 2019-12-23 DIAGNOSIS — E559 Vitamin D deficiency, unspecified: Secondary | ICD-10-CM

## 2019-12-23 DIAGNOSIS — Z7189 Other specified counseling: Secondary | ICD-10-CM | POA: Diagnosis not present

## 2019-12-23 DIAGNOSIS — M4120 Other idiopathic scoliosis, site unspecified: Secondary | ICD-10-CM | POA: Diagnosis not present

## 2019-12-23 DIAGNOSIS — Z0000 Encounter for general adult medical examination without abnormal findings: Secondary | ICD-10-CM | POA: Diagnosis not present

## 2019-12-23 DIAGNOSIS — Z7185 Encounter for immunization safety counseling: Secondary | ICD-10-CM

## 2019-12-23 LAB — POCT URINALYSIS DIP (PROADVANTAGE DEVICE)
Bilirubin, UA: NEGATIVE
Glucose, UA: NEGATIVE mg/dL
Ketones, POC UA: NEGATIVE mg/dL
Leukocytes, UA: NEGATIVE
Nitrite, UA: NEGATIVE
Protein Ur, POC: NEGATIVE mg/dL
Specific Gravity, Urine: 1.03
Urobilinogen, Ur: 0.2
pH, UA: 5.5 (ref 5.0–8.0)

## 2019-12-24 LAB — CBC WITH DIFFERENTIAL/PLATELET
Basophils Absolute: 0 10*3/uL (ref 0.0–0.2)
Basos: 1 %
EOS (ABSOLUTE): 0.5 10*3/uL — ABNORMAL HIGH (ref 0.0–0.4)
Eos: 6 %
Hematocrit: 42.2 % (ref 34.0–46.6)
Hemoglobin: 14.4 g/dL (ref 11.1–15.9)
Immature Grans (Abs): 0 10*3/uL (ref 0.0–0.1)
Immature Granulocytes: 0 %
Lymphocytes Absolute: 2.2 10*3/uL (ref 0.7–3.1)
Lymphs: 28 %
MCH: 29.1 pg (ref 26.6–33.0)
MCHC: 34.1 g/dL (ref 31.5–35.7)
MCV: 85 fL (ref 79–97)
Monocytes Absolute: 0.6 10*3/uL (ref 0.1–0.9)
Monocytes: 8 %
Neutrophils Absolute: 4.7 10*3/uL (ref 1.4–7.0)
Neutrophils: 57 %
Platelets: 254 10*3/uL (ref 150–450)
RBC: 4.95 x10E6/uL (ref 3.77–5.28)
RDW: 15 % (ref 11.7–15.4)
WBC: 8 10*3/uL (ref 3.4–10.8)

## 2019-12-24 LAB — LIPID PANEL
Chol/HDL Ratio: 2.8 ratio (ref 0.0–4.4)
Cholesterol, Total: 181 mg/dL (ref 100–199)
HDL: 64 mg/dL (ref >39)
LDL Chol Calc (NIH): 104 mg/dL — ABNORMAL HIGH (ref 0–99)
Triglycerides: 69 mg/dL (ref 0–149)
VLDL Cholesterol Cal: 13 mg/dL (ref 5–40)

## 2019-12-24 LAB — COMPREHENSIVE METABOLIC PANEL
ALT: 17 IU/L (ref 0–32)
AST: 15 IU/L (ref 0–40)
Albumin/Globulin Ratio: 1.7 (ref 1.2–2.2)
Albumin: 4.5 g/dL (ref 3.9–5.0)
Alkaline Phosphatase: 49 IU/L (ref 48–121)
BUN/Creatinine Ratio: 9 (ref 9–23)
BUN: 7 mg/dL (ref 6–20)
Bilirubin Total: <0.2 mg/dL (ref 0.0–1.2)
CO2: 23 mmol/L (ref 20–29)
Calcium: 9.6 mg/dL (ref 8.7–10.2)
Chloride: 102 mmol/L (ref 96–106)
Creatinine, Ser: 0.79 mg/dL (ref 0.57–1.00)
GFR calc Af Amer: 119 mL/min/{1.73_m2} (ref >59)
GFR calc non Af Amer: 103 mL/min/{1.73_m2} (ref >59)
Globulin, Total: 2.7 g/dL (ref 1.5–4.5)
Glucose: 82 mg/dL (ref 65–99)
Potassium: 4.2 mmol/L (ref 3.5–5.2)
Sodium: 138 mmol/L (ref 134–144)
Total Protein: 7.2 g/dL (ref 6.0–8.5)

## 2019-12-24 LAB — VITAMIN D 25 HYDROXY (VIT D DEFICIENCY, FRACTURES): Vit D, 25-Hydroxy: 44.8 ng/mL (ref 30.0–100.0)

## 2019-12-28 LAB — CYTOLOGY - PAP
Chlamydia: NEGATIVE
Comment: NEGATIVE
Diagnosis: NEGATIVE

## 2020-12-23 NOTE — Progress Notes (Signed)
Chief Complaint  Patient presents with   Annual Exam    Fasting annual exam with pelvic. No concerns. Did not get flu shot last year, did get covid vaccines-does not have card with her. I will look up in [facility], does not want booster.     Carolyn Juarez is a 30 y.o. female who presents for a complete physical.   She has the following concerns:  Vitamin D deficiency.  Last level was 44.8 in 12/2019 when taking 2000 IU daily.  Previously was 27.6 in 11/2018 when very sporadic in taking any vitamins (MVI or D).   She is currently taking 2000 IU daily from D3, plus other vitamins (unsure if they have D).   Immunization History  Administered Date(s) Administered   DTaP 05/10/1992, 07/11/1992, 09/30/1992, 06/23/1993, 04/20/1996   HPV Quadrivalent 09/16/2006, 12/25/2006, 05/07/2007   Hepatitis A, Adult 11/18/2014, 11/21/2015   Hepatitis B 04/08/1992, 05/10/1992, 10/17/1992   HiB (PRP-OMP) 05/10/1992, 09/30/1992, 06/23/1993   IPV 05/10/1992, 07/11/1992, 09/30/1992, 04/20/1996   Influenza Inj Mdck Quad Pf 02/25/2018   Influenza,inj,Quad PF,6+ Mos 05/27/2017, 01/30/2019   MMR 06/23/1993, 04/20/1996   Meningococcal B, OMV 11/18/2014, 12/23/2014   Meningococcal Conjugate 09/16/2006, 11/18/2014   Td 11/19/2016   Tdap 09/16/2006   Didn't get a flu shot this year Had 2 COVID vaccines, doesn't want booster Last Pap smear: 12/2019, normal, and negative chlamydia.  Paps also normal in 2016, 2017, 2018.  Uses condoms.  Same partner as last year, monogamous since 2018.  Last mammogram: never Last colonoscopy: never Last DEXA: never Dentist: twice yearly Ophtho: never  Exercise:  Walks her dog 20-25 mins once a week. No other regular exercise.  Still hasn't used the weighted vest yet. Eats yogurt, cheese, puts milk in her cereal. Fast food 2x/week, has been cooking more at home  Lipids: Lab Results  Component Value Date   CHOL 181 12/23/2019   HDL 64 12/23/2019   LDLCALC 104 (H) 12/23/2019    TRIG 69 12/23/2019   CHOLHDL 2.8 12/23/2019   PMH, PSH, SH and FH were reviewed and updated  Outpatient Encounter Medications as of 12/26/2020  Medication Sig Note   Misc Natural Products (SUPER GREENS PO) Take 4 each by mouth daily. 12/26/2020: gummy   Multiple Vitamins-Minerals (HAIR SKIN AND NAILS FORMULA PO) Take 1 tablet by mouth every morning.    VITAMIN D PO Take 2,000 Units by mouth.     No facility-administered encounter medications on file as of 12/26/2020.   No Known Allergies   ROS:  The patient denies anorexia, fever, weight changes, headaches,  vision changes, decreased hearing, ear pain, sore throat, breast concerns, chest pain, palpitations, dizziness, syncope, dyspnea on exertion, cough, swelling, nausea, vomiting, diarrhea, constipation, abdominal pain, melena, hematochezia, indigestion/heartburn, hematuria, incontinence, dysuria, irregular menstrual cycles, vaginal discharge, odor or itch, genital lesions, joint pains, numbness, tingling, weakness, tremor, suspicious skin lesions, depression, anxiety, abnormal bleeding/bruising, or enlarged lymph nodes. Denies any back pain, known scoliosis.     PHYSICAL EXAM:  BP 118/80   Pulse 72   Ht '5\' 1"'  (1.549 m)   Wt 113 lb 12.8 oz (51.6 kg)   LMP 12/05/2020 (Exact Date)   BMI 21.50 kg/m   Wt Readings from Last 3 Encounters:  12/26/20 113 lb 12.8 oz (51.6 kg)  12/23/19 116 lb (52.6 kg)  11/27/18 100 lb 6.4 oz (45.5 kg)    General Appearance:     Alert, cooperative, no distress, appears stated age   Head:  Normocephalic, without obvious abnormality, atraumatic   Eyes:     PERRL, conjunctiva/corneas clear, EOM's intact, fundi benign   Ears:     Normal TM's and external ear canals   Nose:    Not examined (wearing mask due to COVID-19 pandemic)  Throat:  Not examined (wearing mask due to COVID-19 pandemic)    Neck:    Supple, no lymphadenopathy;  thyroid:  no enlargement/ tenderness/nodules; no carotid bruit or JVD    Back:     Spine nontender, no CVA tenderness. Scoliosis noted in mid-back.  No muscle spasm or tenderness  Lungs:      Clear to auscultation bilaterally without wheezes, rales or ronchi; respirations unlabored   Chest Wall:     No tenderness or deformity    Heart:     Regular rate and rhythm, S1 and S2 normal, no murmur, rub or gallop   Breast Exam:     No tenderness, masses, or nipple discharge or inversion. No axillary lymphadenopathy  Abdomen:      Soft, non-tender, nondistended, normoactive bowel sounds, no masses, no hepatosplenomegaly  Genitalia:     Normal external genitalia without lesions.  BUS and vagina normal; no abnormal vaginal discharge or cervical motion tenderness.  Uterus is normal, R adnexa normal.  L adnexa--some fullness noted.  Pap not performed   Rectal:     Not performed due to age<40 and no related complaints   Extremities:    No clubbing, cyanosis or edema   Pulses:    2+ and symmetric all extremities   Skin:    Skin color, texture, turgor normal, no lesions. Tattoo on back  Lymph nodes:    Cervical, supraclavicular, and axillary nodes normal   Neurologic:    Normal strength, sensation and gait; reflexes 2+ and symmetric throughout                Psych:    Normal mood, affect, hygiene and grooming    ASSESSMENT/PLAN:  Annual physical exam - Plan: POCT Urinalysis DIP (Proadvantage Device), Visual acuity screening  Adnexal mass - L adnexal fullness--ddx reviewed with pt, suspecting either cyst or fibroid. Will eval with [place] - Plan: US Pelvic Complete With Transvaginal    Discussed monthly self breast exams and yearly mammograms after the age of 64; at least 30 minutes of aerobic activity at least 5 days/week, weight-bearing exercise at least 2x/week; proper sunscreen use reviewed; healthy diet, including goals of calcium and vitamin D intake and alcohol recommendations (less than or equal to 1 drink/day) reviewed; regular seatbelt use; changing batteries in smoke  detectors.  Immunization recommendations discussed--continue yearly flu shots. COVID booster recommended. Pap q3 yrs    F/u 1 year, sooner prn

## 2020-12-23 NOTE — Patient Instructions (Addendum)
  HEALTH MAINTENANCE RECOMMENDATIONS:  It is recommended that you get at least 30 minutes of aerobic exercise at least 5 days/week (for weight loss, you may need as much as 60-90 minutes). This can be any activity that gets your heart rate up. This can be divided in 10-15 minute intervals if needed, but try and build up your endurance at least once a week.  Weight bearing exercise is also recommended twice weekly.  Eat a healthy diet with lots of vegetables, fruits and fiber.  "Colorful" foods have a lot of vitamins (ie green vegetables, tomatoes, red peppers, etc).  Limit sweet tea, regular sodas and alcoholic beverages, all of which has a lot of calories and sugar.  Up to 1 alcoholic drink daily may be beneficial for women (unless trying to lose weight, watch sugars).  Drink a lot of water.  Calcium recommendations are 1200-1500 mg daily (1500 mg for postmenopausal women or women without ovaries), and vitamin D 1000 IU daily.  This should be obtained from diet and/or supplements (vitamins), and calcium should not be taken all at once, but in divided doses.  Monthly self breast exams and yearly mammograms for women over the age of 69 is recommended.  Sunscreen of at least SPF 30 should be used on all sun-exposed parts of the skin when outside between the hours of 10 am and 4 pm (not just when at beach or pool, but even with exercise, golf, tennis, and yard work!)  Use a sunscreen that says "broad spectrum" so it covers both UVA and UVB rays, and make sure to reapply every 1-2 hours.  Remember to change the batteries in your smoke detectors when changing your clock times in the spring and fall. Carbon monoxide detectors are recommended for your home.  Use your seat belt every time you are in a car, and please drive safely and not be distracted with cell phones and texting while driving.  COVID booster is recommended. Yearly flu shots are recommended.  We are scheduling you for a pelvic ultrasound  due to feeling something on the left side of your pelvic exam (possibilities include fibroid uterus, ovarian cyst, among the more common, benign findings; it is also possible that it could have been bowel, and not related to the female organs).

## 2020-12-26 ENCOUNTER — Ambulatory Visit (INDEPENDENT_AMBULATORY_CARE_PROVIDER_SITE_OTHER): Payer: BC Managed Care – PPO | Admitting: Family Medicine

## 2020-12-26 ENCOUNTER — Other Ambulatory Visit: Payer: Self-pay

## 2020-12-26 ENCOUNTER — Encounter: Payer: Self-pay | Admitting: Family Medicine

## 2020-12-26 VITALS — BP 118/80 | HR 72 | Ht 61.0 in | Wt 113.8 lb

## 2020-12-26 DIAGNOSIS — N9489 Other specified conditions associated with female genital organs and menstrual cycle: Secondary | ICD-10-CM

## 2020-12-26 DIAGNOSIS — Z0000 Encounter for general adult medical examination without abnormal findings: Secondary | ICD-10-CM | POA: Diagnosis not present

## 2020-12-26 LAB — POCT URINALYSIS DIP (PROADVANTAGE DEVICE)
Bilirubin, UA: NEGATIVE
Blood, UA: NEGATIVE
Glucose, UA: NEGATIVE mg/dL
Ketones, POC UA: NEGATIVE mg/dL
Leukocytes, UA: NEGATIVE
Nitrite, UA: NEGATIVE
Protein Ur, POC: NEGATIVE mg/dL
Specific Gravity, Urine: 1.025
Urobilinogen, Ur: NEGATIVE
pH, UA: 5.5 (ref 5.0–8.0)

## 2021-01-09 ENCOUNTER — Other Ambulatory Visit: Payer: BC Managed Care – PPO

## 2021-01-17 ENCOUNTER — Other Ambulatory Visit: Payer: Self-pay

## 2021-01-17 ENCOUNTER — Ambulatory Visit
Admission: RE | Admit: 2021-01-17 | Discharge: 2021-01-17 | Disposition: A | Discharge status: Home / Self Care | Payer: BC Managed Care – PPO | Source: Ambulatory Visit | Attending: Family Medicine | Admitting: Family Medicine

## 2021-01-17 DIAGNOSIS — N9489 Other specified conditions associated with female genital organs and menstrual cycle: Secondary | ICD-10-CM

## 2021-08-24 ENCOUNTER — Ambulatory Visit: Payer: BC Managed Care – PPO | Admitting: Family Medicine

## 2021-08-24 VITALS — BP 122/72 | HR 72 | Ht 61.0 in | Wt 112.4 lb

## 2021-08-24 DIAGNOSIS — M659 Synovitis and tenosynovitis, unspecified: Secondary | ICD-10-CM | POA: Diagnosis not present

## 2021-08-24 DIAGNOSIS — M25531 Pain in right wrist: Secondary | ICD-10-CM

## 2021-08-24 MED ORDER — MELOXICAM 15 MG PO TABS: 15.0000 mg | ORAL_TABLET | Freq: Every day | ORAL | 0 refills | Status: DC

## 2021-08-24 NOTE — Patient Instructions (Signed)
Go to [facility] at your convenience [phone] or [place]) ?This is for the x-ray of the wrist. ? ?Take the anti-inflammatory once daily with food. ?If it bothers your stomach, cut the dose in half. ?Do not take any naproxen along with this (this medication will also help with any cramps). ?Don't take ibuprofen/advil/motrin/aleve/naproxen, BC or Goody powder. ?If you have additional pain, and you've taken the meloxicam, it is fine to take acetaminophen (tylenol). ? ?Take it every day until you feel like it has completely resolved (plan a minimum of 7-10 days, you may take the full 15 days if needed). ?In the future, if you have flares of pain and you don't have any meloxicam left, you can take the naproxen 1-2 pills, twice daily with food. ? ?You can also try wearing a thumb spica splint (either just the thumb splint, or one associated with the wrist brace). You can check a medical supply store for this, vs a pharmacy. ?

## 2021-08-24 NOTE — Progress Notes (Signed)
Chief Complaint  ?Patient presents with  ? Wrist Pain  ?  Right wrist pain. Started 06/2020 when she it hit her hand/wrist on a table. Was very painful. Hurts off and on. Does take NSAIDS for menstrual cramps and feels like she may mask the pain. Does have some weakness at times (when picking up children) in the right hand/wrist. Feels like her right wrist looks different than her left wrist (bump/swollen). Used brace and made it more stable but felt like it was too much pressure for her wrist. Patient is L hand dominant.   ? ?L-handed patient presents with ongoing Right wrist pain since an injury in 06/2020. ?She whacked the back of the right hand/wrist on the corner of a table. She had some swelling the next day, no bruising. ?It had gotten somewhat better, but flared again in 09/2020.  It eased off, pain would come and go, but never completely resolved. ? 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  She also has some stiffness and soreness. She wonders if the weather also affects it. ? ?She notices that the right wrist pain resolves when taking naproxen for her menstrual cramps (takes once daily x 3 days during her cycle). She feels like it looks abnormal, bone sticks out m ore. ? ? ? ?PMH, PSH, SH reviewed ? ?Outpatient Encounter Medications as of 08/24/2021  ?Medication Sig Note  ? VITAMIN D PO Take 2,000 Units by mouth.  08/24/2021: 2-3 times weekly  ? [DISCONTINUED] Multiple Vitamins-Minerals (HAIR SKIN AND NAILS FORMULA PO) Take 1 tablet by mouth every morning.   ? naproxen sodium (ALEVE) 220 MG tablet Take 220 mg by mouth daily as needed. 08/24/2021: Takes once daily as needed for menstrual cramps  ? [DISCONTINUED] Misc Natural Products (SUPER GREENS PO) Take 4 each by mouth daily. 12/26/2020: gummy  ? ?No facility-administered encounter medications on file as of 08/24/2021.  ? ?No Known Allergies ? ?ROS: Denies fever, chills, URI symptoms, headaches, dizziness, shortness of breath, chest pain.  Denies nausea,  vomiting, abdominal pain, bleeding, bruising, rash. Denies numbness, tingling or weakness.  ?See HPI ? ? ?PHYSICAL EXAM: ? ?BP 122/72   Pulse 72   Ht 5\' 1"  (1.549 m)   Wt 112 lb 6.4 oz (51 kg)   LMP 08/16/2021 (Exact Date)   BMI 21.24 kg/m?  ? ?Pleasant, well-appearing female in no distress ?R UE: 2+ pulses.  FROM at wrist.  Nontender to palpation over bones (radius, ulna, metacarpals, carpals).  Prominence to the distal radius on the R, nontender, no warmth ?Normal strength and no pain with thumb extension/flexion against resistance. ?Mildly +Finkelstein test ? ?ASSESSMENT/PLAN: ? ?Right wrist pain - Plan: DG Wrist Complete Right, meloxicam (MOBIC) 15 MG tablet ? ?Tenosynovitis of right wrist - course of NSAIDs, risks/SE reviewed.  Also rec trial of thumb spica. Contact 10/16/2021 if not resolving--consider OT/PT vs ortho referral - Plan: meloxicam (MOBIC) 15 MG tablet ? ? ?Go to [place] at your convenience [phone] or [address]) ?This is for the x-ray of the wrist. ? ?Take the anti-inflammatory once daily with food. ?If it bothers your stomach, cut the dose in half. ?Do not take any naproxen along with this (this medication will also help with any cramps). ?Don't take ibuprofen/advil/motrin/aleve/naproxen, BC or Goody powder. ?If you have additional pain, and you've taken the meloxicam, it is fine to take acetaminophen (tylenol). ? ?Take it every day until you feel like it has completely resolved (plan a minimum of 7-10 days,  you may take the full 15 days if needed). ?In the future, if you have flares of pain and you don't have any meloxicam left, you can take the naproxen 1-2 pills, twice daily with food. ? ?You can also try wearing a thumb spica splint (either just the thumb splint, or one associated with the wrist brace). You can check a medical supply store for this, vs a pharmacy. ?

## 2021-08-25 ENCOUNTER — Ambulatory Visit
Admission: RE | Admit: 2021-08-25 | Discharge: 2021-08-25 | Disposition: A | Discharge status: Home / Self Care | Payer: BC Managed Care – PPO | Source: Ambulatory Visit | Attending: Family Medicine | Admitting: Family Medicine

## 2021-08-25 ENCOUNTER — Other Ambulatory Visit: Payer: Self-pay

## 2021-08-25 DIAGNOSIS — M25531 Pain in right wrist: Secondary | ICD-10-CM

## 2021-12-31 NOTE — Progress Notes (Signed)
Chief Complaint  Patient presents with   Annual Exam    Fasting annual exam with pelvic, no new concerns. Did have flu shot last season, no new covid vaccines-just first two.     Carolyn Juarez is a 31 y.o. female who presents for a complete physical.    Seen in 08/2021 with R wrist pain, diagnosed as tenosynovitis. She used a splint and meloxicam and pain resolved.  Vitamin D deficiency.  Last level was 44.8 in 12/2019 when taking 2000 IU daily.  Previously was 27.6 in 11/2018 when very sporadic in taking any vitamins (MVI or D).   She admits to being sporadic with her vitamin D intake, taking 2000 IU about 2-3 times/week.  Immunization History  Administered Date(s) Administered   DTaP 05/10/1992, 07/11/1992, 09/30/1992, 06/23/1993, 04/20/1996   HPV Quadrivalent 09/16/2006, 12/25/2006, 05/07/2007   Hepatitis A, Adult 11/18/2014, 11/21/2015   Hepatitis B 04/08/1992, 05/10/1992, 10/17/1992   HiB (PRP-OMP) 05/10/1992, 09/30/1992, 06/23/1993   IPV 05/10/1992, 07/11/1992, 09/30/1992, 04/20/1996   Influenza Inj Mdck Quad Pf 02/25/2018   Influenza,inj,Quad PF,6+ Mos 05/27/2017, 01/30/2019   Influenza-Unspecified 02/23/2021   MMR 06/23/1993, 04/20/1996   Meningococcal B, OMV 11/18/2014, 12/23/2014   Meningococcal Conjugate 09/16/2006, 11/18/2014   PFIZER(Purple Top)SARS-COV-2 Vaccination 01/29/2020, 02/19/2020   Td 11/19/2016   Tdap 09/16/2006   Last Pap smear: 12/2019, normal, and negative chlamydia.  Paps also normal in 2016, 2017, 2018.  Uses condoms.  Same partner as last year, monogamous since 2018.  Last mammogram: never Last colonoscopy: never Last DEXA: never Dentist: twice yearly Ophtho: never  Exercise:  Joined diabetes prevention program at her church.  Walks 60 mins 3x/week.  She now has a mini cycling machine (floor-based), uses 2x/week x 15 minutes; also has a step, hasn't started using it much yet. Did yoga regularly with a coworker for a month (helped with back  pain--stopped when her friend got pregnant). She walks with 2# weights (for the hour, 3x/week).  Eats yogurt, cheese, puts milk in her cereal. Fast food 2x/week during the school year (none over the summer, cooking at home).  Lipids: Lab Results  Component Value Date   CHOL 181 12/23/2019   HDL 64 12/23/2019   LDLCALC 104 (H) 12/23/2019   TRIG 69 12/23/2019   CHOLHDL 2.8 12/23/2019    PMH, PSH, SH and FH were reviewed and updated  Outpatient Encounter Medications as of 01/01/2022  Medication Sig Note   VITAMIN D PO Take 2,000 Units by mouth.  08/24/2021: 2-3 times weekly   naproxen sodium (ALEVE) 220 MG tablet Take 220 mg by mouth daily as needed. (Patient not taking: Reported on 01/01/2022) 08/24/2021: Takes once daily as needed for menstrual cramps   [DISCONTINUED] meloxicam (MOBIC) 15 MG tablet Take 1 tablet (15 mg total) by mouth daily. Take with food, daily until your pain resolves    No facility-administered encounter medications on file as of 01/01/2022.   No Known Allergies   ROS:  The patient denies anorexia, fever, weight changes, headaches,  vision changes, decreased hearing, ear pain, sore throat, breast concerns, chest pain, palpitations, dizziness, syncope, dyspnea on exertion, cough, swelling, nausea, vomiting, diarrhea, constipation, abdominal pain, melena, hematochezia, indigestion/heartburn, hematuria, incontinence, dysuria, irregular menstrual cycles, vaginal discharge, odor or itch, genital lesions, joint pains, numbness, tingling, weakness, tremor, suspicious skin lesions, depression, anxiety, abnormal bleeding/bruising, or enlarged lymph nodes. Denies any back pain, known scoliosis.  Yoga helped her back. Some mild discomfort only with prolonged sitting. R wrist pain resolved.  PHYSICAL EXAM:  BP 124/80   Pulse 80   Ht 5' 1.5" (1.562 m)   Wt 111 lb 3.2 oz (50.4 kg)   LMP 12/08/2021 (Exact Date)   BMI 20.67 kg/m    Wt Readings from Last 3 Encounters:   01/01/22 111 lb 3.2 oz (50.4 kg)  08/24/21 112 lb 6.4 oz (51 kg)  12/26/20 113 lb 12.8 oz (51.6 kg)    General Appearance:     Alert, cooperative, no distress, appears stated age   Head:     Normocephalic, without obvious abnormality, atraumatic   Eyes:     PERRL, conjunctiva/corneas clear, EOM's intact, fundi benign   Ears:     Normal TM's and external ear canals   Nose:    Normal, no drainage or sinus tenderness  Throat:  Normal, no mucosal lesions    Neck:    Supple, no lymphadenopathy;  thyroid:  no enlargement/ tenderness/nodules; no carotid bruit or JVD   Back:     Spine nontender, no CVA tenderness. Scoliosis noted in mid-back.  No muscle spasm or tenderness  Lungs:      Clear to auscultation bilaterally without wheezes, rales or ronchi; respirations unlabored   Chest Wall:     No tenderness or deformity    Heart:     Regular rate and rhythm, S1 and S2 normal, no murmur, rub or gallop   Breast Exam:     No tenderness, masses, or nipple discharge or inversion. Small white spot in the L nipple--this is dry. No active milk drainage could be elicited. No axillary lymphadenopathy  Abdomen:      Soft, non-tender, nondistended, normoactive bowel sounds, no masses, no hepatosplenomegaly  Genitalia:     Normal external genitalia without lesions.  BUS and vagina normal; no abnormal vaginal discharge or cervical motion tenderness.  No adnexal masses or tenderness. At left adnexa, high up, there is a palpable mass--unable to tell if could be coming off the back of the uterus (ie fibroid), vs possible palpable stool.  Nontender, not fixed. Pap not performed   Rectal:     Not performed due to age<40 and no related complaints   Extremities:    No clubbing, cyanosis or edema   Pulses:    2+ and symmetric all extremities   Skin:    Skin color, texture, turgor normal, no lesions. Tattoo on back  Lymph nodes:    Cervical, supraclavicular, inguinal and axillary nodes normal   Neurologic:    Normal  strength, sensation and gait; reflexes 2+ and symmetric throughout                Psych:    Normal mood, affect, hygiene and grooming  Review of chart--she had [place] 01/2021 due to some fullness at L adnexa--US was entirely normal    ASSESSMENT/PLAN:  Annual physical exam  Vitamin D deficiency - suspect will be low again, sporadic in taking her vitamins. Encouraged daily intake, can double up with missed doses.   Possible mass/fullness on pelvic exam on L side.  Given that [place] was normal last year (no mass, fibroid), thinking it might be stool.  Discussed waiting on US--to return if she develops any pain, heavy/abnormal bleeding, for re-exam and then order, vs re-exam at her CPE in 1 year.  In monogamous relationship, not at risk for STD's. No labs done today.  Is due for Hep C screen. Will hold off until next year--and also discussed possibly checking Hep B Sag and Sab (  given her caregiving, to ensure immune).    Discussed monthly self breast exams and yearly mammograms after the age of 45; at least 30 minutes of aerobic activity at least 5 days/week, weight-bearing exercise at least 2x/week; proper sunscreen use reviewed; healthy diet, including goals of calcium and vitamin D intake and alcohol recommendations (less than or equal to 1 drink/day) reviewed; regular seatbelt use; changing batteries in smoke detectors.  Immunization recommendations discussed--yearly flu shots recommended.  Updated bivalent COVID booster recommended, when available in the Fall. Pap q3 yrs, due 2024.    F/u 1 year, sooner prn

## 2021-12-31 NOTE — Patient Instructions (Addendum)
HEALTH MAINTENANCE RECOMMENDATIONS:  It is recommended that you get at least 30 minutes of aerobic exercise at least 5 days/week (for weight loss, you may need as much as 60-90 minutes). This can be any activity that gets your heart rate up. This can be divided in 10-15 minute intervals if needed, but try and build up your endurance at least once a week.  Weight bearing exercise is also recommended twice weekly.  Eat a healthy diet with lots of vegetables, fruits and fiber.  "Colorful" foods have a lot of vitamins (ie green vegetables, tomatoes, red peppers, etc).  Limit sweet tea, regular sodas and alcoholic beverages, all of which has a lot of calories and sugar.  Up to 1 alcoholic drink daily may be beneficial for women (unless trying to lose weight, watch sugars).  Drink a lot of water.  Calcium recommendations are 1200-1500 mg daily (1500 mg for postmenopausal women or women without ovaries), and vitamin D 1000 IU daily.  This should be obtained from diet and/or supplements (vitamins), and calcium should not be taken all at once, but in divided doses.  Monthly self breast exams and yearly mammograms for women over the age of 51 is recommended.  Sunscreen of at least SPF 30 should be used on all sun-exposed parts of the skin when outside between the hours of 10 am and 4 pm (not just when at beach or pool, but even with exercise, golf, tennis, and yard work!)  Use a sunscreen that says "broad spectrum" so it covers both UVA and UVB rays, and make sure to reapply every 1-2 hours.  Remember to change the batteries in your smoke detectors when changing your clock times in the spring and fall. Carbon monoxide detectors are recommended for your home.  Use your seat belt every time you are in a car, and please drive safely and not be distracted with cell phones and texting while driving.  Yearly flu shots are recommended in the Fall. The updated bivalent COVID booster is recommended when it becomes  available in the Fall.  Please try and take your Vitamin D daily.  We can wait on getting another ultrasound of the pelvis since it was normal last year. We can either wait until your exam next year (if no abdominal pain, heavy or abnormal bleeding), or you can return sooner (with symptoms, or in 3-6 months) for a repeat exam.  Calcium Content in Foods Calcium is the most abundant mineral in the body. Most of the body's calcium supply is stored in bones and teeth. Calcium helps many parts of the body function normally, including: Blood and blood vessels. Nerves. Hormones. Muscles. Bones and teeth. When your calcium stores are low, you may be at risk for low bone mass, bone loss, and broken bones (fractures). When you get enough calcium, it helps to support strong bones and teeth throughout your life. Calcium is especially important for: Children during growth spurts. Girls during adolescence. Women who are pregnant or breastfeeding. Women after their menstrual cycle stops (postmenopause). Women whose menstrual cycle has stopped due to anorexia nervosa or regular intense exercise. People who cannot eat or digest dairy products. Vegans. Recommended daily amounts of calcium: Women (ages 61 to 44): 1,000 mg per day. Women (ages 66 and older): 1,200 mg per day. Men (ages 13 to 43): 1,000 mg per day. Men (ages 27 and older): 1,200 mg per day. Women (ages 30 to 82): 1,300 mg per day. Men (ages 30 to 60): 1,300 mg  per day. General information Eat foods that are high in calcium. Try to get most of your calcium from food. Some people may benefit from taking calcium supplements. Check with your health care provider or diet and nutrition specialist (dietitian) before starting any calcium supplements. Calcium supplements may interact with certain medicines. Too much calcium may cause other health problems, such as constipation and kidney stones. For the body to absorb calcium, it needs vitamin D.  Sources of vitamin D include: Skin exposure to direct sunlight. Foods, such as egg yolks, liver, mushrooms, saltwater fish, and fortified milk. Vitamin D supplements. Check with your health care provider or dietitian before starting any vitamin D supplements. What foods are high in calcium?  Foods that are high in calcium contain more than 100 milligrams per serving. Fruits Fortified orange juice or other fruit juice, 300 mg per 8 oz serving. Vegetables Collard greens, 360 mg per 8 oz serving. Kale, 100 mg per 8 oz serving. Bok choy, 160 mg per 8 oz serving. Grains Fortified ready-to-eat cereals, 100 to 1,000 mg per 8 oz serving. Fortified frozen waffles, 200 mg in 2 waffles. Oatmeal, 140 mg in 1 cup. Meats and other proteins Sardines, canned with bones, 325 mg per 3 oz serving. Salmon, canned with bones, 180 mg per 3 oz serving. Canned shrimp, 125 mg per 3 oz serving. Baked beans, 160 mg per 4 oz serving. Tofu, firm, made with calcium sulfate, 253 mg per 4 oz serving. Dairy Yogurt, plain, low-fat, 310 mg per 6 oz serving. Nonfat milk, 300 mg per 8 oz serving. American cheese, 195 mg per 1 oz serving. Cheddar cheese, 205 mg per 1 oz serving. Cottage cheese 2%, 105 mg per 4 oz serving. Fortified soy, rice, or almond milk, 300 mg per 8 oz serving. Mozzarella, part skim, 210 mg per 1 oz serving. The items listed above may not be a complete list of foods high in calcium. Actual amounts of calcium may be different depending on processing. Contact a dietitian for more information. What foods are lower in calcium? Foods that are lower in calcium contain 50 mg or less per serving. Fruits Apple, about 6 mg. Banana, about 12 mg. Vegetables Lettuce, 19 mg per 2 oz serving. Tomato, about 11 mg. Grains Rice, 4 mg per 6 oz serving. Boiled potatoes, 14 mg per 8 oz serving. White bread, 6 mg per slice. Meats and other proteins Egg, 27 mg per 2 oz serving. Red meat, 7 mg per 4 oz  serving. Chicken, 17 mg per 4 oz serving. Fish, cod, or trout, 20 mg per 4 oz serving. Dairy Cream cheese, regular, 14 mg per 1 Tbsp serving. Brie cheese, 50 mg per 1 oz serving. Parmesan cheese, 70 mg per 1 Tbsp serving. The items listed above may not be a complete list of foods lower in calcium. Actual amounts of calcium may be different depending on processing. Contact a dietitian for more information. Summary Calcium is an important mineral in the body because it affects many functions. Getting enough calcium helps support strong bones and teeth throughout your life. Try to get most of your calcium from food. Calcium supplements may interact with certain medicines. Check with your health care provider or dietitian before starting any calcium supplements. This information is not intended to replace advice given to you by your health care provider. Make sure you discuss any questions you have with your health care provider. Document Revised: 09/23/2019 Document Reviewed: 09/23/2019 Elsevier Patient Education  2023 [employer].

## 2022-01-01 ENCOUNTER — Ambulatory Visit (INDEPENDENT_AMBULATORY_CARE_PROVIDER_SITE_OTHER): Payer: BC Managed Care – PPO | Admitting: Family Medicine

## 2022-01-01 ENCOUNTER — Encounter: Payer: Self-pay | Admitting: Family Medicine

## 2022-01-01 VITALS — BP 124/80 | HR 80 | Ht 61.5 in | Wt 111.2 lb

## 2022-01-01 DIAGNOSIS — Z0000 Encounter for general adult medical examination without abnormal findings: Secondary | ICD-10-CM

## 2022-01-01 DIAGNOSIS — E559 Vitamin D deficiency, unspecified: Secondary | ICD-10-CM

## 2022-01-01 LAB — POCT URINALYSIS DIP (PROADVANTAGE DEVICE)
Bilirubin, UA: NEGATIVE
Glucose, UA: NEGATIVE mg/dL
Ketones, POC UA: NEGATIVE mg/dL
Leukocytes, UA: NEGATIVE
Nitrite, UA: NEGATIVE
Protein Ur, POC: NEGATIVE mg/dL
Specific Gravity, Urine: 1.03
Urobilinogen, Ur: NEGATIVE
pH, UA: 6 (ref 5.0–8.0)

## 2022-01-01 NOTE — Addendum Note (Signed)
Addended by: Debbrah Alar F on: 01/01/2022 10:19 AM   Modules accepted: Orders

## 2022-02-14 ENCOUNTER — Encounter: Payer: Self-pay | Admitting: Internal Medicine

## 2022-03-20 ENCOUNTER — Encounter: Payer: Self-pay | Admitting: Internal Medicine

## 2022-04-03 ENCOUNTER — Ambulatory Visit: Payer: BC Managed Care – PPO | Admitting: Medical

## 2022-04-03 ENCOUNTER — Encounter: Payer: Self-pay | Admitting: Medical

## 2022-04-03 ENCOUNTER — Other Ambulatory Visit (INDEPENDENT_AMBULATORY_CARE_PROVIDER_SITE_OTHER): Payer: BC Managed Care – PPO

## 2022-04-03 VITALS — BP 110/70 | HR 68 | Temp 98.6°F | Wt 108.2 lb

## 2022-04-03 DIAGNOSIS — J029 Acute pharyngitis, unspecified: Secondary | ICD-10-CM

## 2022-04-03 DIAGNOSIS — J069 Acute upper respiratory infection, unspecified: Secondary | ICD-10-CM | POA: Diagnosis not present

## 2022-04-03 DIAGNOSIS — R051 Acute cough: Secondary | ICD-10-CM | POA: Diagnosis not present

## 2022-04-03 DIAGNOSIS — R6883 Chills (without fever): Secondary | ICD-10-CM

## 2022-04-03 LAB — POCT RAPID STREP A (OFFICE): Rapid Strep A Screen: NEGATIVE

## 2022-04-03 LAB — POC COVID19 BINAXNOW: SARS Coronavirus 2 Ag: NEGATIVE

## 2022-04-03 NOTE — Patient Instructions (Addendum)
Thank you for giving me the opportunity to serve you today.   Your diagnosis today includes: Encounter Diagnoses  Name Primary?   Acute cough Yes   Sore throat    Upper respiratory tract infection, unspecified type     Specific home care recommendations today include: Only take over-the-counter (OTC) or prescription medicines for pain, discomfort, or fever as directed by your caregiver.   Decongestant: You may use OTC Guaifenesin (Mucinex plain) for congestion.  You may use Pseudoephedrine (Sudafed) only if you don't have blood pressure problems or a diagnosis of hypertension. Cough suppression: If you have cough from drainage, you may use over-the-counter Dextromethorphan (Delsym) as directed on the label Sore throat remedies:  You may use salt water gargles, warm fluids such as coffee or hot tea, or honey/tea/lemon mixture to sooth sore throat pain.  You may use OTC sore throat remedies such as Cepacol lozenges or Chloraseptic spray for sore throat pain. Runny nose and sneezing remedies: You may use OTC antihistamine such as Zyrtec or Benadryl, but caution as these can cause drowsiness.   Pain/fever relief: You may use over-the-counter Tylenol for pain or fever Drink extra fluids. Fluids help thin the mucus so your sinuses can drain more easily.  Applying either moist heat or ice packs to the sinus areas may help relieve discomfort. Use saline nasal sprays to help moisten your sinuses. The sprays can be found at your local drugstore.    Please call or return if worse or not improving in the next few days.       Upper Respiratory Infection, Adult An upper respiratory infection (URI) is also known as the common cold. It is often caused by a type of germ (virus). Colds are easily spread (contagious). You can pass it to others by kissing, coughing, sneezing, or drinking out of the same glass. Usually, you get better in 1 or 2 weeks.  HOME CARE  Only take medicine as told by your doctor.   Use a warm mist humidifier or breathe in steam from a hot shower.  Drink enough water and fluids to keep your pee (urine) clear or pale yellow.  Get plenty of rest.  Return to work when your temperature is back to normal or as told by your doctor. You may use a face mask and wash your hands to stop your cold from spreading.  GET HELP RIGHT AWAY IF:  After the first few days, you feel you are getting worse.  You have questions about your medicine.  You have chills, shortness of breath, or brown or red spit (mucus).  You have yellow or brown snot (nasal discharge) or pain in the face, especially when you bend forward.  You have a fever, puffy (swollen) neck, pain when you swallow, or white spots in the back of your throat.  You have a bad headache, ear pain, sinus pain, or chest pain.  You have a high-pitched whistling sound when you breathe in and out (wheezing).  You have a lasting cough or cough up blood.  You have sore muscles or a stiff neck.  MAKE SURE YOU:  Understand these instructions.  Will watch your condition.  Will get help right away if you are not doing well or get worse.  Document Released: 11/14/2007 Document Revised: 02/07/2011 Document Reviewed: 10/02/2010 [facility] Patient Information 2012 [employer].

## 2022-04-03 NOTE — Progress Notes (Signed)
Subjective:  Carolyn Juarez is a 31 y.o. female who presents for Chief Complaint  Patient presents with   Sore Throat    Yesterday woke up with ST and hurts to swallow. Woke up this morning with HA, Chills, body aches. Strep and covid negative today     Here for illness.  Accompanied by mother today.  She notes bad sore throat, chills, body aches, congestion this morning, headache.   No fever.  Has had slight cough.  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   Using tylenol.   No other aggravating or relieving factors.    No other c/o.  Past Medical History:  Diagnosis Date   Varicella    as a child; had + titers checked elsewhere   Vitamin D deficiency    Current Outpatient Medications on File Prior to Visit  Medication Sig Dispense Refill   naproxen sodium (ALEVE) 220 MG tablet Take 220 mg by mouth daily as needed.     VITAMIN D PO Take 2,000 Units by mouth.      No current facility-administered medications on file prior to visit.     The following portions of the patient's history were reviewed and updated as appropriate: allergies, current medications, past family history, past medical history, past social history, past surgical history and problem list.  ROS Otherwise as in subjective above  Objective: BP 110/70   Pulse 68   Temp 98.6 F (37 C)   Wt 108 lb 3.2 oz (49.1 kg)   BMI 20.11 kg/m   General appearance: alert, no distress, well developed, well nourished HEENT: normocephalic, sclerae anicteric, conjunctiva pink and moist, TMs pearly, nares patent, no discharge, mild erythema, pharynx with slight erythema, no exudate Oral cavity: MMM, no lesions Neck: supple, no lymphadenopathy, no thyromegaly, no masses Heart: RRR, normal S1, S2, no murmurs Lungs: CTA bilaterally, no wheezes, rhonchi, or rales    Assessment: Encounter Diagnoses  Name Primary?   Acute cough Yes   Sore throat    Upper respiratory tract infection, unspecified type       Plan: Initial testing for strep, covid, flu all negative  Symptoms suggest viral URI.  Covid confirmation swab sent.   Specific home care recommendations today include: Only take over-the-counter (OTC) or prescription medicines for pain, discomfort, or fever as directed by your caregiver.   Decongestant: You may use OTC Guaifenesin (Mucinex plain) for congestion.  You may use Pseudoephedrine (Sudafed) only if you don't have blood pressure problems or a diagnosis of hypertension. Cough suppression: If you have cough from drainage, you may use over-the-counter Dextromethorphan (Delsym) as directed on the label Sore throat remedies:  You may use salt water gargles, warm fluids such as coffee or hot tea, or honey/tea/lemon mixture to sooth sore throat pain.  You may use OTC sore throat remedies such as Cepacol lozenges or Chloraseptic spray for sore throat pain. Runny nose and sneezing remedies: You may use OTC antihistamine such as Zyrtec or Benadryl, but caution as these can cause drowsiness.   Pain/fever relief: You may use over-the-counter Tylenol for pain or fever Drink extra fluids. Fluids help thin the mucus so your sinuses can drain more easily.  Applying either moist heat or ice packs to the sinus areas may help relieve discomfort. Use saline nasal sprays to help moisten your sinuses. The sprays can be found at your local drugstore.    Please call or return if worse or not improving in the next few  days.     Carolyn Juarez was seen today for sore throat.  Diagnoses and all orders for this visit:  Acute cough -     Novel Coronavirus, NAA ([facility])  Sore throat -     Novel Coronavirus, NAA ([facility])  Upper respiratory tract infection, unspecified type -     Novel Coronavirus, NAA ([facility])    Follow up: prn

## 2022-04-04 LAB — NOVEL CORONAVIRUS, NAA: SARS-CoV-2, NAA: NOT DETECTED

## 2022-07-30 IMAGING — CR DG WRIST COMPLETE 3+V*R*
4 series · 4 of 4 positions shown · non-contrast
Comparison: None.

CLINICAL DATA: Trauma to right wrist and hand [DATE]. Distal
radius pain. Prominent bone. Evaluate for abnormality. Hit distal
radius on corner of counter 1 year ago. Continued pain.

EXAM:
RIGHT WRIST - COMPLETE 3+ VIEW

[x wrist pa right]
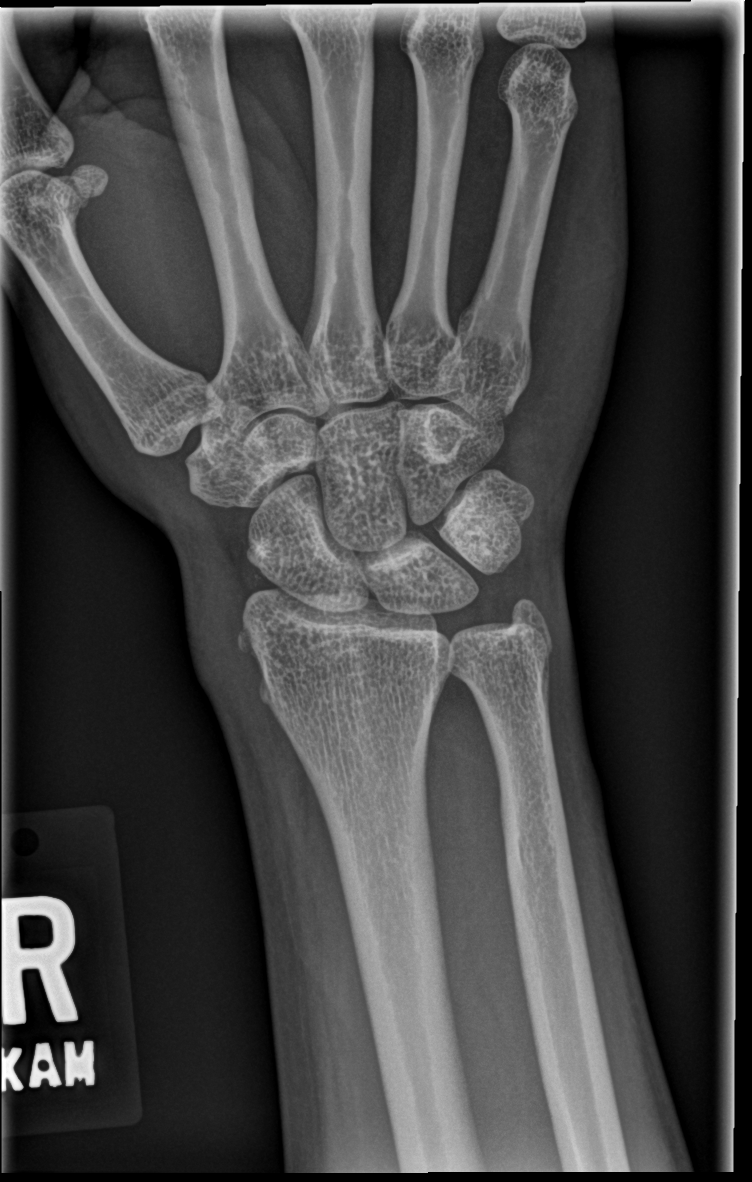

[x wrist obl right]
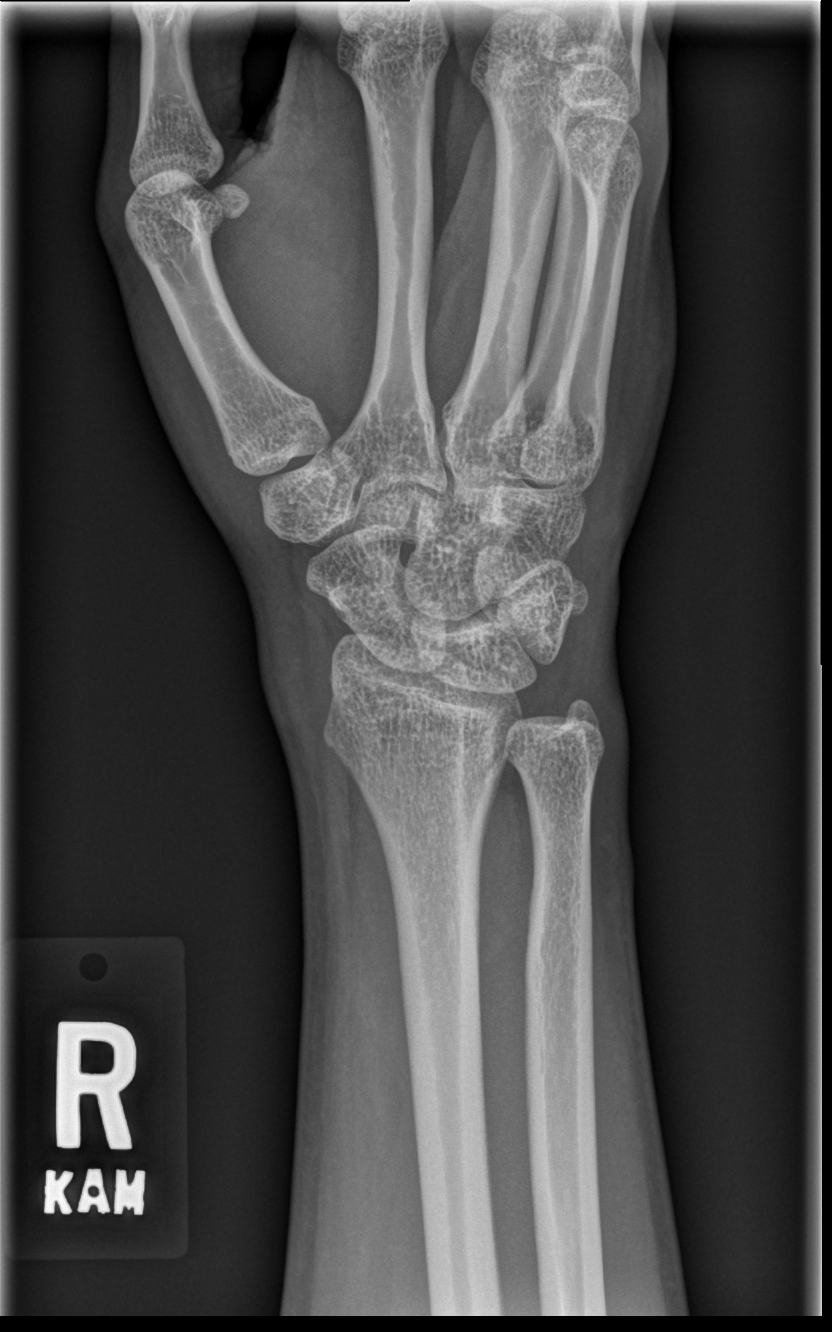

[x wrist lat right (1 of 2)]
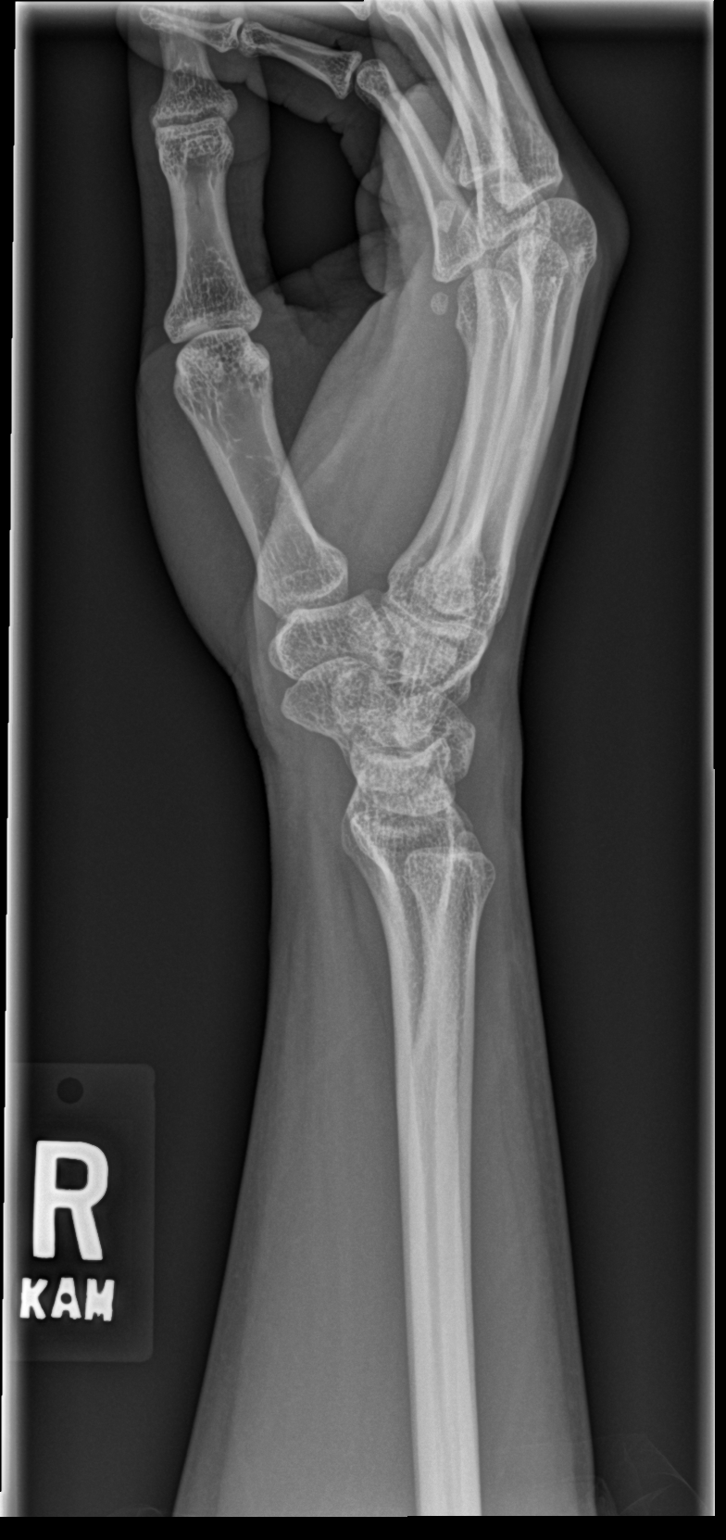

[x wrist lat right (2 of 2)]
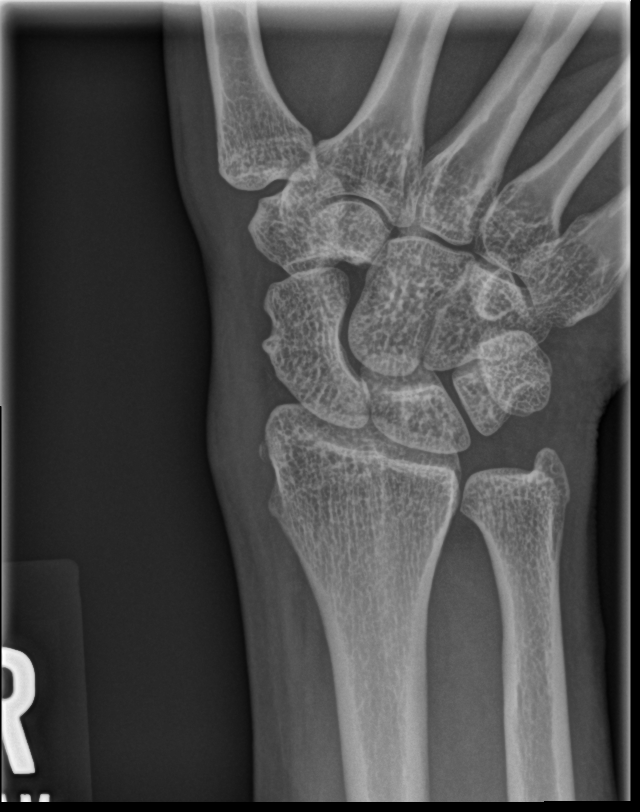

[4 of 4 positions shown; findings below may reference images not displayed]

FINDINGS: Neutral ulnar variance. There is a small ossific density measuring
up to approximately 1.5 mm in transverse dimension and 3 mm along
the longitudinal dimension of the radius, extending laterally from
the distal lateral radius (at the radial tuberosity. This is
nonspecific but may represent the sequela of prior trauma. There
appears to be mild curvilinear lucency at the junction of the
ossicle and the dominant radius and there appears to be regional
mild soft tissue swelling.

Joint spaces are preserved.  No acute fracture or dislocation.
IMPRESSION: There is focal soft tissue swelling around a tiny ossicle at the
lateral aspect of the distal radius which appears separated from the
underlying radius by mild curvilinear lucency. This is nonspecific
but may represent the sequela of subacute trauma.

## 2023-01-14 ENCOUNTER — Encounter: Payer: Self-pay | Admitting: *Deleted

## 2023-01-14 NOTE — Progress Notes (Deleted)
No chief complaint on file.   Carolyn Juarez is a 32 y.o. female who presents for a complete physical.     At her physical last year, possible mass/fullness on pelvic exam was noted on L side.  Given that [place] was normal last year (no mass, fibroid), thinking it might be stool.  Discussed waiting on US--to return if she develops any pain, heavy/abnormal bleeding, for re-exam and then order, vs re-exam at her CPE in 1 year.  Vitamin D deficiency.  Last level was 44.8 in 12/2019 when taking 2000 IU daily.  Previously was 27.6 in 11/2018 when very sporadic in taking any vitamins (MVI or D).   Last year she reported being sporadic with her vitamin D intake, taking 2000 IU about 2-3 times/week. She is currently taking  Immunization History  Administered Date(s) Administered   DTaP 05/10/1992, 07/11/1992, 09/30/1992, 06/23/1993, 04/20/1996   HIB (PRP-OMP) 05/10/1992, 09/30/1992, 06/23/1993   HPV Quadrivalent 09/16/2006, 12/25/2006, 05/07/2007   Hepatitis A, Adult 11/18/2014, 11/21/2015   Hepatitis B 04/08/1992, 05/10/1992, 10/17/1992   IPV 05/10/1992, 07/11/1992, 09/30/1992, 04/20/1996   Influenza Inj Mdck Quad Pf 02/25/2018   Influenza,inj,Quad PF,6+ Mos 05/27/2017, 01/30/2019   Influenza-Unspecified 02/23/2021   MMR 06/23/1993, 04/20/1996   Meningococcal B, OMV 11/18/2014, 12/23/2014   Meningococcal Conjugate 09/16/2006, 11/18/2014   PFIZER(Purple Top)SARS-COV-2 Vaccination 01/29/2020, 02/19/2020   Td 11/19/2016   Tdap 09/16/2006   Last Pap smear: 12/2019, normal, and negative chlamydia.  Paps also normal in 2016, 2017, 2018.  Uses condoms.  Same partner as last year, monogamous since 2018.  Last mammogram: never Last colonoscopy: never Last DEXA: never Dentist: twice yearly Ophtho: never  Exercise:   Joined diabetes prevention program at her church.  Walks 60 mins 3x/week.  She now has a mini cycling machine (floor-based), uses 2x/week x 15 minutes; also has a step, hasn't started  using it much yet. Did yoga regularly with a coworker for a month (helped with back pain--stopped when her friend got pregnant). She walks with 2# weights (for the hour, 3x/week).  Eats yogurt, cheese, puts milk in her cereal. Fast food 2x/week during the school year (none over the summer, cooking at home).  Lipids: Lab Results  Component Value Date   CHOL 181 12/23/2019   HDL 64 12/23/2019   LDLCALC 104 (H) 12/23/2019   TRIG 69 12/23/2019   CHOLHDL 2.8 12/23/2019    PMH, PSH, SH and FH were reviewed and updated    ROS:  The patient denies anorexia, fever, weight changes, headaches,  vision changes, decreased hearing, ear pain, sore throat, breast concerns, chest pain, palpitations, dizziness, syncope, dyspnea on exertion, cough, swelling, nausea, vomiting, diarrhea, constipation, abdominal pain, melena, hematochezia, indigestion/heartburn, hematuria, incontinence, dysuria, irregular menstrual cycles, vaginal discharge, odor or itch, genital lesions, joint pains, numbness, tingling, weakness, tremor, suspicious skin lesions, depression, anxiety, abnormal bleeding/bruising, or enlarged lymph nodes.  Denies any back pain, known scoliosis.  Yoga helped her back. Some mild discomfort only with prolonged sitting.      PHYSICAL EXAM:  There were no vitals taken for this visit.   Wt Readings from Last 3 Encounters:  04/03/22 108 lb 3.2 oz (49.1 kg)  01/01/22 111 lb 3.2 oz (50.4 kg)  08/24/21 112 lb 6.4 oz (51 kg)    General Appearance:     Alert, cooperative, no distress, appears stated age   Head:     Normocephalic, without obvious abnormality, atraumatic   Eyes:     PERRL, conjunctiva/corneas clear, EOM's intact,  fundi benign   Ears:     Normal TM's and external ear canals   Nose:    Normal, no drainage or sinus tenderness  Throat:  Normal, no mucosal lesions    Neck:    Supple, no lymphadenopathy;  thyroid:  no enlargement/ tenderness/nodules; no carotid bruit or JVD    Back:     Spine nontender, no CVA tenderness. Scoliosis noted in mid-back.  No muscle spasm or tenderness  Lungs:      Clear to auscultation bilaterally without wheezes, rales or ronchi; respirations unlabored   Chest Wall:     No tenderness or deformity    Heart:     Regular rate and rhythm, S1 and S2 normal, no murmur, rub or gallop   Breast Exam:     No tenderness, masses, or nipple discharge or inversion. Small white spot in the L nipple--this is dry. No active milk drainage could be elicited. No axillary lymphadenopathy  Abdomen:      Soft, non-tender, nondistended, normoactive bowel sounds, no masses, no hepatosplenomegaly  Genitalia:     Normal external genitalia without lesions.  BUS and vagina normal; no abnormal vaginal discharge or cervical motion tenderness.  No adnexal masses or tenderness. At left adnexa, high up, there is a palpable mass--unable to tell if could be coming off the back of the uterus (ie fibroid), vs possible palpable stool.  Nontender, not fixed. Pap not performed   Rectal:     Not performed due to age<40 and no related complaints   Extremities:    No clubbing, cyanosis or edema   Pulses:    2+ and symmetric all extremities   Skin:    Skin color, texture, turgor normal, no lesions. Tattoo on back  Lymph nodes:    Cervical, supraclavicular, inguinal and axillary nodes normal   Neurologic:    Normal strength, sensation and gait; reflexes 2+ and symmetric throughout                Psych:    Normal mood, affect, hygiene and grooming   ***UPDATE breast exam (white spot L nipple? UPDATE if any palpable mass high in L adnexa    ASSESSMENT/PLAN:   Pap today Enter if she got flu shot or any COVID boosters since last physical  C-met, cbc, D, Hep C screen.  Consider Hep B Sag and Sab (given her caregiving, to ensure immune). Consider lipids if change in diet, desired. Consider TSH (last check on low side in 2016, sx? Wt?)      Discussed monthly self breast  exams and yearly mammograms after the age of 50; at least 30 minutes of aerobic activity at least 5 days/week, weight-bearing exercise at least 2x/week; proper sunscreen use reviewed; healthy diet, including goals of calcium and vitamin D intake and alcohol recommendations (less than or equal to 1 drink/day) reviewed; regular seatbelt use; changing batteries in smoke detectors.  Immunization recommendations discussed--yearly flu shots recommended.  Updated bivalent COVID booster recommended, when available in the Fall.    F/u 1 year, sooner prn

## 2023-01-15 ENCOUNTER — Ambulatory Visit (INDEPENDENT_AMBULATORY_CARE_PROVIDER_SITE_OTHER): Payer: BC Managed Care – PPO | Admitting: Family Medicine

## 2023-01-15 ENCOUNTER — Encounter: Payer: Self-pay | Admitting: Family Medicine

## 2023-01-15 VITALS — BP 104/70 | HR 103 | Temp 98.4°F | Ht 61.0 in | Wt 109.4 lb

## 2023-01-15 DIAGNOSIS — J3489 Other specified disorders of nose and nasal sinuses: Secondary | ICD-10-CM

## 2023-01-15 DIAGNOSIS — U071 COVID-19: Secondary | ICD-10-CM

## 2023-01-15 LAB — POC COVID19 BINAXNOW: SARS Coronavirus 2 Ag: POSITIVE — AB

## 2023-01-15 NOTE — Progress Notes (Signed)
   Subjective:    Patient ID: Carolyn Juarez, female    DOB: Jul 14, 1990, 32 y.o.   MRN: [id number]  HPI She has a 2-day history of sinus pressure, postnasal drainage and only a slight cough.  No fever, chills, cough, shortness of breath   Review of Systems     Objective:    Physical Exam COVID test on it was positive patient was not examined.       Assessment & Plan:  Sinus pain - Plan: POC COVID-19  COVID-19 I explained that at this point she needs to stay sequestered, when she starts to feel better if she can go out in public with a mask for another 5 days.  If her symptoms get worse in regard to uncontrolled fever, worsening cough and shortness of breath, call otherwise.  Herself symptomatically.  She was comfortable with that.

## 2023-01-16 ENCOUNTER — Encounter: Payer: BC Managed Care – PPO | Admitting: Family Medicine

## 2023-01-16 DIAGNOSIS — E559 Vitamin D deficiency, unspecified: Secondary | ICD-10-CM

## 2023-01-16 DIAGNOSIS — Z0000 Encounter for general adult medical examination without abnormal findings: Secondary | ICD-10-CM

## 2023-01-16 DIAGNOSIS — Z1159 Encounter for screening for other viral diseases: Secondary | ICD-10-CM

## 2023-01-23 ENCOUNTER — Encounter: Payer: BC Managed Care – PPO | Admitting: Family Medicine

## 2023-01-23 DIAGNOSIS — Z0000 Encounter for general adult medical examination without abnormal findings: Secondary | ICD-10-CM

## 2023-01-23 DIAGNOSIS — E559 Vitamin D deficiency, unspecified: Secondary | ICD-10-CM

## 2023-01-23 DIAGNOSIS — Z1159 Encounter for screening for other viral diseases: Secondary | ICD-10-CM

## 2023-04-18 ENCOUNTER — Encounter: Payer: Self-pay | Admitting: *Deleted

## 2023-04-19 NOTE — Progress Notes (Unsigned)
No chief complaint on file.  Carolyn Juarez is a 32 y.o. female who presents for a complete physical.       At her physical last year, possible mass/fullness on pelvic exam was noted on L side.  Given that [place] was normal last year (no mass, fibroid), thinking it might be stool.  Discussed waiting on US--to return if she develops any pain, heavy/abnormal bleeding, for re-exam and then order, vs re-exam at her CPE in 1 year.   Vitamin D deficiency.  Last level was 44.8 in 12/2019 when taking 2000 IU daily.  Previously was 27.6 in 11/2018 when very sporadic in taking any vitamins (MVI or D).   Last year she reported being sporadic with her vitamin D intake, taking 2000 IU about 2-3 times/week. She is currently taking    Immunization History  Administered Date(s) Administered   DTaP 05/10/1992, 07/11/1992, 09/30/1992, 06/23/1993, 04/20/1996   HIB (PRP-OMP) 05/10/1992, 09/30/1992, 06/23/1993   HPV Quadrivalent 09/16/2006, 12/25/2006, 05/07/2007   Hepatitis A, Adult 11/18/2014, 11/21/2015   Hepatitis B 04/08/1992, 05/10/1992, 10/17/1992   IPV 05/10/1992, 07/11/1992, 09/30/1992, 04/20/1996   Influenza Inj Mdck Quad Pf 02/25/2018   Influenza,inj,Quad PF,6+ Mos 05/27/2017, 01/30/2019   Influenza-Unspecified 02/23/2021, 02/27/2022   MMR 06/23/1993, 04/20/1996   Meningococcal B, OMV 11/18/2014, 12/23/2014   Meningococcal Conjugate 09/16/2006, 11/18/2014   PFIZER(Purple Top)SARS-COV-2 Vaccination 01/29/2020, 02/19/2020   Td 11/19/2016   Tdap 09/16/2006     Last Pap smear: 12/2019, normal, and negative chlamydia.  Paps also normal in 2016, 2017, 2018.  Uses condoms.  Same partner as last year, monogamous since 2018.  Last mammogram: never Last colonoscopy: never Last DEXA: never Dentist: twice yearly Ophtho: never  Exercise:   Joined diabetes prevention program at her church.  Walks 60 mins 3x/week.  She now has a mini cycling machine (floor-based), uses 2x/week x 15 minutes; also has a step,  hasn't started using it much yet. Did yoga regularly with a coworker for a month (helped with back pain--stopped when her friend got pregnant). She walks with 2# weights (for the hour, 3x/week).   Eats yogurt, cheese, puts milk in her cereal. Fast food 2x/week during the school year (none over the summer, cooking at home).   Lipids: Lab Results  Component Value Date   CHOL 181 12/23/2019   HDL 64 12/23/2019   LDLCALC 104 (H) 12/23/2019   TRIG 69 12/23/2019   CHOLHDL 2.8 12/23/2019       PMH, PSH, SH and FH were reviewed and updated       ROS:  The patient denies anorexia, fever, weight changes, headaches,  vision changes, decreased hearing, ear pain, sore throat, breast concerns, chest pain, palpitations, dizziness, syncope, dyspnea on exertion, cough, swelling, nausea, vomiting, diarrhea, constipation, abdominal pain, melena, hematochezia, indigestion/heartburn, hematuria, incontinence, dysuria, irregular menstrual cycles, vaginal discharge, odor or itch, genital lesions, joint pains, numbness, tingling, weakness, tremor, suspicious skin lesions, depression, anxiety, abnormal bleeding/bruising, or enlarged lymph nodes.   Denies any back pain, known scoliosis.  Yoga helped her back. Some mild discomfort only with prolonged sitting.         PHYSICAL EXAM:   There were no vitals taken for this visit.  Wt Readings from Last 3 Encounters:  01/15/23 109 lb 6.4 oz (49.6 kg)  04/03/22 108 lb 3.2 oz (49.1 kg)  01/01/22 111 lb 3.2 oz (50.4 kg)          General Appearance:     Alert, cooperative, no distress, appears stated  age   Head:     Normocephalic, without obvious abnormality, atraumatic   Eyes:     PERRL, conjunctiva/corneas clear, EOM's intact, fundi benign   Ears:     Normal TM's and external ear canals   Nose:    Normal, no drainage or sinus tenderness  Throat:  Normal, no mucosal lesions     Neck:    Supple, no lymphadenopathy;  thyroid:  no enlargement/  tenderness/nodules; no carotid bruit or JVD   Back:     Spine nontender, no CVA tenderness. Scoliosis noted in mid-back.  No muscle spasm or tenderness  Lungs:      Clear to auscultation bilaterally without wheezes, rales or ronchi; respirations unlabored   Chest Wall:     No tenderness or deformity    Heart:     Regular rate and rhythm, S1 and S2 normal, no murmur, rub or gallop   Breast Exam:     No tenderness, masses, or nipple discharge or inversion. Small white spot in the L nipple--this is dry. No active milk drainage could be elicited. No axillary lymphadenopathy  Abdomen:      Soft, non-tender, nondistended, normoactive bowel sounds, no masses, no hepatosplenomegaly  Genitalia:     Normal external genitalia without lesions.  BUS and vagina normal; no abnormal vaginal discharge or cervical motion tenderness.  No adnexal masses or tenderness. At left adnexa, high up, there is a palpable mass--unable to tell if could be coming off the back of the uterus (ie fibroid), vs possible palpable stool.  Nontender, not fixed. Pap not performed   Rectal:     Not performed due to age<40 and no related complaints   Extremities:    No clubbing, cyanosis or edema   Pulses:    2+ and symmetric all extremities   Skin:    Skin color, texture, turgor normal, no lesions. Tattoo on back  Lymph nodes:    Cervical, supraclavicular, inguinal and axillary nodes normal   Neurologic:    Normal strength, sensation and gait; reflexes 2+ and symmetric throughout                Psych:    Normal mood, affect, hygiene and grooming     ***UPDATE breast exam (white spot L nipple? UPDATE if any palpable mass high in L adnexa      ASSESSMENT/PLAN:   Flu, COVID (has been 3 months since she had COVID)  Pap today Enter if she got flu shot or any COVID boosters since last physical   C-met, cbc, D, Hep C screen.  Consider Hep B Sag and Sab (given her caregiving, to ensure immune). Consider lipids if change in diet,  desired. Consider TSH (last check on low side in 2016, sx? Wt?)       Discussed monthly self breast exams and yearly mammograms after the age of 47; at least 30 minutes of aerobic activity at least 5 days/week, weight-bearing exercise at least 2x/week; proper sunscreen use reviewed; healthy diet, including goals of calcium and vitamin D intake and alcohol recommendations (less than or equal to 1 drink/day) reviewed; regular seatbelt use; changing batteries in smoke detectors.  Immunization recommendations discussed--yearly flu shots recommended.  Updated bivalent COVID booster recommended.     F/u 1 year, sooner prn

## 2023-04-22 ENCOUNTER — Ambulatory Visit (INDEPENDENT_AMBULATORY_CARE_PROVIDER_SITE_OTHER): Payer: BC Managed Care – PPO | Admitting: Family Medicine

## 2023-04-22 ENCOUNTER — Other Ambulatory Visit (HOSPITAL_COMMUNITY)
Admission: RE | Admit: 2023-04-22 | Discharge: 2023-04-22 | Disposition: A | Discharge status: Home / Self Care | Payer: BC Managed Care – PPO | Source: Ambulatory Visit | Attending: Family Medicine | Admitting: Family Medicine

## 2023-04-22 ENCOUNTER — Encounter: Payer: Self-pay | Admitting: Family Medicine

## 2023-04-22 VITALS — BP 118/68 | HR 80 | Ht 61.0 in | Wt 107.6 lb

## 2023-04-22 DIAGNOSIS — Z0000 Encounter for general adult medical examination without abnormal findings: Secondary | ICD-10-CM | POA: Diagnosis not present

## 2023-04-22 DIAGNOSIS — E559 Vitamin D deficiency, unspecified: Secondary | ICD-10-CM

## 2023-04-22 DIAGNOSIS — L258 Unspecified contact dermatitis due to other agents: Secondary | ICD-10-CM | POA: Diagnosis not present

## 2023-04-22 DIAGNOSIS — Z23 Encounter for immunization: Secondary | ICD-10-CM

## 2023-04-22 DIAGNOSIS — Z1159 Encounter for screening for other viral diseases: Secondary | ICD-10-CM

## 2023-04-22 NOTE — Patient Instructions (Addendum)
  HEALTH MAINTENANCE RECOMMENDATIONS:  It is recommended that you get at least 30 minutes of aerobic exercise at least 5 days/week (for weight loss, you may need as much as 60-90 minutes). This can be any activity that gets your heart rate up. This can be divided in 10-15 minute intervals if needed, but try and build up your endurance at least once a week.  Weight bearing exercise is also recommended twice weekly.  Eat a healthy diet with lots of vegetables, fruits and fiber.  "Colorful" foods have a lot of vitamins (ie green vegetables, tomatoes, red peppers, etc).  Limit sweet tea, regular sodas and alcoholic beverages, all of which has a lot of calories and sugar.  Up to 1 alcoholic drink daily may be beneficial for women (unless trying to lose weight, watch sugars).  Drink a lot of water.  Calcium recommendations are 1200-1500 mg daily (1500 mg for postmenopausal women or women without ovaries), and vitamin D 1000 IU daily.  This should be obtained from diet and/or supplements (vitamins), and calcium should not be taken all at once, but in divided doses.  Monthly self breast exams and yearly mammograms for women over the age of 81 is recommended.  Sunscreen of at least SPF 30 should be used on all sun-exposed parts of the skin when outside between the hours of 10 am and 4 pm (not just when at beach or pool, but even with exercise, golf, tennis, and yard work!)  Use a sunscreen that says "broad spectrum" so it covers both UVA and UVB rays, and make sure to reapply every 1-2 hours.  Remember to change the batteries in your smoke detectors when changing your clock times in the spring and fall. Carbon monoxide detectors are recommended for your home.  Use your seat belt every time you are in a car, and please drive safely and not be distracted with cell phones and texting while driving.  Please let us know if you develop any left sided pelvic pain, or bowel issues. I still feel a fullness  behind/near the left ovary. This is the same thing I've felt for the last 2 years, and the pelvic ultrasound done in 2022 was normal. We can re-image if the exam changes, or if you develop symptoms. I suspect it may be stool in the nearby colon.  We discussed trying to cut back on processed meats and red meats, trying to eat more vegetables and plant-based proteins (lentils, beans, chick peas, etc).

## 2023-04-23 LAB — CBC WITH DIFFERENTIAL/PLATELET
Basophils Absolute: 0 10*3/uL (ref 0.0–0.2)
Basos: 0 %
EOS (ABSOLUTE): 0 10*3/uL (ref 0.0–0.4)
Eos: 0 %
Hematocrit: 43.8 % (ref 34.0–46.6)
Hemoglobin: 14.5 g/dL (ref 11.1–15.9)
Immature Grans (Abs): 0 10*3/uL (ref 0.0–0.1)
Immature Granulocytes: 0 %
Lymphocytes Absolute: 1.8 10*3/uL (ref 0.7–3.1)
Lymphs: 19 %
MCH: 28.5 pg (ref 26.6–33.0)
MCHC: 33.1 g/dL (ref 31.5–35.7)
MCV: 86 fL (ref 79–97)
Monocytes Absolute: 0.7 10*3/uL (ref 0.1–0.9)
Monocytes: 7 %
Neutrophils Absolute: 7 10*3/uL (ref 1.4–7.0)
Neutrophils: 74 %
Platelets: 308 10*3/uL (ref 150–450)
RBC: 5.09 x10E6/uL (ref 3.77–5.28)
RDW: 14.1 % (ref 11.7–15.4)
WBC: 9.5 10*3/uL (ref 3.4–10.8)

## 2023-04-23 LAB — CMP14+EGFR
ALT: 14 [IU]/L (ref 0–32)
AST: 15 [IU]/L (ref 0–40)
Albumin: 4.6 g/dL (ref 3.9–4.9)
Alkaline Phosphatase: 62 IU/L (ref 44–121)
BUN/Creatinine Ratio: 16 (ref 9–23)
BUN: 12 mg/dL (ref 6–20)
Bilirubin Total: 0.5 mg/dL (ref 0.0–1.2)
CO2: 21 mmol/L (ref 20–29)
Calcium: 9.9 mg/dL (ref 8.7–10.2)
Chloride: 99 mmol/L (ref 96–106)
Creatinine, Ser: 0.77 mg/dL (ref 0.57–1.00)
Globulin, Total: 3 g/dL (ref 1.5–4.5)
Glucose: 69 mg/dL — ABNORMAL LOW (ref 70–99)
Potassium: 4.2 mmol/L (ref 3.5–5.2)
Sodium: 139 mmol/L (ref 134–144)
Total Protein: 7.6 g/dL (ref 6.0–8.5)
eGFR: 106 mL/min/{1.73_m2} (ref >59)

## 2023-04-23 LAB — HEPATITIS C ANTIBODY: Hep C Virus Ab: NONREACTIVE

## 2023-04-23 LAB — VITAMIN D 25 HYDROXY (VIT D DEFICIENCY, FRACTURES): Vit D, 25-Hydroxy: 31.7 ng/mL (ref 30.0–100.0)

## 2023-04-24 LAB — CYTOLOGY - PAP
Comment: NEGATIVE
Diagnosis: NEGATIVE
High risk HPV: NEGATIVE

## 2024-04-22 NOTE — Patient Instructions (Addendum)
  HEALTH MAINTENANCE RECOMMENDATIONS:  It is recommended that you get at least 30 minutes of aerobic exercise at least 5 days/week (for weight loss, you may need as much as 60-90 minutes). This can be any activity that gets your heart rate up. This can be divided in 10-15 minute intervals if needed, but try and build up your endurance at least once a week.  Weight bearing exercise is also recommended twice weekly.  Eat a healthy diet with lots of vegetables, fruits and fiber.  Colorful foods have a lot of vitamins (ie green vegetables, tomatoes, red peppers, etc).  Limit sweet tea, regular sodas and alcoholic beverages, all of which has a lot of calories and sugar.  Up to 1 alcoholic drink daily may be beneficial for women (unless trying to lose weight, watch sugars).  Drink a lot of water.  Calcium recommendations are 1200-1500 mg daily (1500 mg for postmenopausal women or women without ovaries), and vitamin D  1000 IU daily.  This should be obtained from diet and/or supplements (vitamins), and calcium should not be taken all at once, but in divided doses.  Monthly self breast exams and yearly mammograms for women over the age of 55 is recommended.  Sunscreen of at least SPF 30 should be used on all sun-exposed parts of the skin when outside between the hours of 10 am and 4 pm (not just when at beach or pool, but even with exercise, golf, tennis, and yard work!)  Use a sunscreen that says broad spectrum so it covers both UVA and UVB rays, and make sure to reapply every 1-2 hours.  Remember to change the batteries in your smoke detectors when changing your clock times in the spring and fall. Carbon monoxide detectors are recommended for your home.  Use your seat belt every time you are in a car, and please drive safely and not be distracted with cell phones and texting while driving.  We discussed getting around 85999 IU of vitamin D /week.  You can do this by taking 2000 IU every day, doubling up  when you miss a dose, or even switching to a 5000 IU dose, and taking that 3x/week. It important to try and take it more regularly throughout the winter, when your levels will drop.

## 2024-04-22 NOTE — Progress Notes (Signed)
 Chief Complaint  Patient presents with   Annual Exam    Fasting annual with pelvic. She states was fasting until at least 3:30-4pm when labs were drawn. Gained a few lbs but is not worried about it. No new partners. No new partners.    Carolyn Juarez is a 33 y.o. female who presents for a complete physical.      (Response above about how long she fasted had to do with the low blood sugar of 69 at last year's physical).  Vitamin D  deficiency.  Last level was 31.7 in 04/2023 when taking 2000 IU somewhat sporadically, a few times/week.  Prior level had been 44.8 in 12/2019 when taking 2000 IU daily.  Previously was 27.6 in 11/2018 when very sporadic in taking any vitamins (MVI or D).   She is currently taking 2000 IU about 2-3 days/week, sporadically. She has been teaching and in grad school, so sometimes forgets.  Component Ref Range & Units (hover) 1 yr ago 4 yr ago 5 yr ago 6 yr ago 7 yr ago 8 yr ago 9 yr ago  Vit D, 25-Hydroxy 31.7 44.8 CM 27.6 Low  CM 27.1 Low  CM 18 Low  R, CM 23 Low  R, CM 26 Low  R, CM     Immunization History  Administered Date(s) Administered   DTaP 05/10/1992, 07/11/1992, 09/30/1992, 06/23/1993, 04/20/1996   HIB (PRP-OMP) 05/10/1992, 09/30/1992, 06/23/1993   HPV Quadrivalent 09/16/2006, 12/25/2006, 05/07/2007   Hepatitis A, Adult 11/18/2014, 11/21/2015   Hepatitis B 04/08/1992, 05/10/1992, 10/17/1992   IPV 05/10/1992, 07/11/1992, 09/30/1992, 04/20/1996   Influenza Inj Mdck Quad Pf 02/25/2018   Influenza, Seasonal, Injecte, Preservative Fre 04/22/2023   Influenza,inj,Quad PF,6+ Mos 05/27/2017, 01/30/2019   Influenza-Unspecified 02/23/2021, 02/27/2022, 03/11/2024   MMR 06/23/1993, 04/20/1996   Meningococcal B, OMV 11/18/2014, 12/23/2014   Meningococcal Conjugate 09/16/2006, 11/18/2014   PFIZER(Purple Top)SARS-COV-2 Vaccination 01/29/2020, 02/19/2020   Td 11/19/2016   Tdap 09/16/2006   Last Pap smear: 04/2023 NILM, HPV negative Uses condoms.  Same partner as  last year, monogamous since 2018.  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 Sometimes walks around the perimeter of the playground while kids are playing--less often since cold and rainy.  No regular exercise otherwise.     Eats yogurt, cheese, puts milk in her cereal (whole) Doesn't eat out much or eat fast food. Red meat--sausage and bacon at breakfast on weekends. 6 eggs/week. Beef 2-3x/week.  Eats a lot of chicken. Veggies--corn, green beans, squash.  Occ beans. Apples, bananas, grapes. +sweet tea just occasionally. Only rarely has soda (cut back after doing a 2 month fast over the summer). Mostly drinks a lot of water.    Lipids: Lab Results  Component Value Date   CHOL 181 12/23/2019   HDL 64 12/23/2019   LDLCALC 104 (H) 12/23/2019   TRIG 69 12/23/2019   CHOLHDL 2.8 12/23/2019     PMH, PSH, SH and FH were reviewed and updated   Outpatient Encounter Medications as of 04/23/2024  Medication Sig Note   naproxen sodium (ALEVE) 220 MG tablet Take 220 mg by mouth daily as needed. 04/23/2024: As needed   VITAMIN D  PO Take 2,000 Units by mouth.  04/23/2024: 2-3 times a week   No facility-administered encounter medications on file as of 04/23/2024.   No Known Allergies    ROS:  The patient denies anorexia, fever, weight changes,  headaches,  vision changes, decreased hearing, ear pain, sore throat, breast concerns, chest pain, palpitations, dizziness, syncope, dyspnea on exertion, cough, swelling, nausea, vomiting, diarrhea, constipation, abdominal pain, melena, hematochezia, indigestion/heartburn, hematuria, incontinence, dysuria, irregular menstrual cycles, vaginal discharge, odor or itch, genital lesions, joint pains, numbness, tingling, weakness, tremor, suspicious skin lesions, depression, anxiety, abnormal bleeding/bruising, rashes or enlarged lymph  nodes. Denies any back pain (known scoliosis) Cycles are regular, not heavy. Denies feeling fatigued or dizzy.     PHYSICAL EXAM:   BP 122/72   Pulse 80   Ht 5' 1 (1.549 m)   Wt 113 lb (51.3 kg)   LMP 03/27/2024   BMI 21.35 kg/m   Wt Readings from Last 3 Encounters:  04/23/24 113 lb (51.3 kg)  04/22/23 107 lb 9.6 oz (48.8 kg)  01/15/23 109 lb 6.4 oz (49.6 kg)          General Appearance:    Alert, cooperative, no distress, appears stated age   Head:     Normocephalic, without obvious abnormality, atraumatic   Eyes:     PERRL, conjunctiva/corneas clear, EOM's intact, fundi benign   Ears:     Normal TM's and external ear canals   Nose:    Normal, no drainage or sinus tenderness  Throat:  Normal, no mucosal lesions     Neck:    Supple, no lymphadenopathy;  thyroid:  no enlargement/ tenderness/nodules; no carotid bruit or JVD   Back:     Spine nontender, no CVA tenderness. Scoliosis noted in mid-back.  No muscle spasm or tenderness  Lungs:      Clear to auscultation bilaterally without wheezes, rales or ronchi; respirations unlabored   Chest Wall:     No tenderness or deformity    Heart:     Regular rate and rhythm, S1 and S2 normal, no murmur, rub or gallop   Breast Exam:     No tenderness, masses, or nipple discharge or inversion (minimal inversion R>L).  No axillary lymphadenopathy  Abdomen:      Soft, non-tender, nondistended, normoactive bowel sounds, no masses, no hepatosplenomegaly  Genitalia:     Normal external genitalia without lesions.  BUS and vagina normal; no abnormal vaginal discharge or cervical motion tenderness.  No adnexal masses or tenderness. There is slight fullness to the L adnexa, nontender. No cervical motion tenderness. Pap not obtained.  Rectal:     Not performed  Extremities:    No clubbing, cyanosis or edema   Pulses:    2+ and symmetric all extremities   Skin:    Skin color, texture, turgor normal, no lesions. Tattoo on back and right lower leg/ankle   Lymph nodes:    Cervical, supraclavicular, inguinal and axillary nodes normal   Neurologic:    Normal strength, sensation and gait; reflexes 2+ and symmetric throughout                Psych:    Normal mood, affect, hygiene and grooming      ASSESSMENT/PLAN:   Annual physical exam - Plan: Vitamin D , 25-hydroxy  Vitamin D  deficiency - discussed recs for daily supplementation, weekly goals (can take higher doses less often, double up when missed, etc).  Recheck and Rx if <26. - Plan: Vitamin D , 25-hydroxy  Reviewed healthy diet and exercise recommendations. Discussed health benefits of exercise.  Discussed monthly self breast exams and yearly mammograms after the age of 9; at least 30 minutes of aerobic activity at least 5 days/week, weight-bearing exercise at  least 2x/week; proper sunscreen use reviewed; healthy diet, including goals of calcium and vitamin D  intake and alcohol recommendations (less than or equal to 1 drink/day) reviewed; regular seatbelt use; changing batteries in smoke detectors.  Immunization recommendations discussed--continue yearly flu shots. Declines COVID booster      F/u 1 year, sooner prn

## 2024-04-23 ENCOUNTER — Ambulatory Visit (INDEPENDENT_AMBULATORY_CARE_PROVIDER_SITE_OTHER): Payer: BC Managed Care – PPO | Admitting: Family Medicine

## 2024-04-23 ENCOUNTER — Encounter: Payer: Self-pay | Admitting: Family Medicine

## 2024-04-23 VITALS — BP 122/72 | HR 80 | Ht 61.0 in | Wt 113.0 lb

## 2024-04-23 DIAGNOSIS — E559 Vitamin D deficiency, unspecified: Secondary | ICD-10-CM | POA: Diagnosis not present

## 2024-04-23 DIAGNOSIS — Z0000 Encounter for general adult medical examination without abnormal findings: Secondary | ICD-10-CM

## 2024-04-23 DIAGNOSIS — Z23 Encounter for immunization: Secondary | ICD-10-CM

## 2024-04-24 ENCOUNTER — Ambulatory Visit: Payer: Self-pay | Admitting: Family Medicine

## 2024-04-24 LAB — VITAMIN D 25 HYDROXY (VIT D DEFICIENCY, FRACTURES): Vit D, 25-Hydroxy: 27 ng/mL — ABNORMAL LOW (ref 30.0–100.0)

## 2024-07-06 ENCOUNTER — Telehealth: Admitting: Physician Assistant

## 2024-07-06 DIAGNOSIS — B9789 Other viral agents as the cause of diseases classified elsewhere: Secondary | ICD-10-CM

## 2024-07-06 DIAGNOSIS — J019 Acute sinusitis, unspecified: Secondary | ICD-10-CM

## 2024-07-06 MED ORDER — IPRATROPIUM BROMIDE 0.03 % NA SOLN: 2.0000 | Freq: Two times a day (BID) | NASAL | Status: AC

## 2024-07-06 MED ORDER — ONDANSETRON 4 MG PO TBDP: 4.0000 mg | ORAL_TABLET | Freq: Three times a day (TID) | ORAL | 0 refills | Status: AC | PRN

## 2024-07-06 NOTE — Addendum Note (Signed)
 Addended by: VIVIENNE FORTUNATO HERO on: 07/06/2024 03:24 PM   Modules accepted: Orders

## 2024-07-06 NOTE — Progress Notes (Signed)
 We are sorry that you are not feeling well.  Here is how we plan to help!  Based on what you have shared with me it looks like you have sinusitis.  Sinusitis is inflammation and infection in the sinus cavities of the head.  Based on your presentation I believe you most likely have Acute Viral Sinusitis.This is an infection most likely caused by a virus. There is not specific treatment for viral sinusitis other than to help you with the symptoms until the infection runs its course.  You may use an oral decongestant such as Mucinex D or if you have glaucoma or high blood pressure use plain Mucinex. Saline nasal spray help and can safely be used as often as needed for congestion, I have prescribed: Ipratropium Bromide  nasal spray 0.03% 2 sprays in eah nostril 2-3 times a day  Some authorities believe that zinc sprays or the use of Echinacea may shorten the course of your symptoms.  Sinus infections are not as easily transmitted as other respiratory infection, however we still recommend that you avoid close contact with loved ones, especially the very young and elderly.  Remember to wash your hands thoroughly throughout the day as this is the number one way to prevent the spread of infection!  Home Care: Only take medications as instructed by your medical team. Do not take these medications with alcohol. A steam or ultrasonic humidifier can help congestion.  You can place a towel over your head and breathe in the steam from hot water coming from a faucet. Avoid close contacts especially the very young and the elderly. Cover your mouth when you cough or sneeze. Always remember to wash your hands.  Get Help Right Away If: You develop worsening fever or sinus pain. You develop a severe head ache or visual changes. Your symptoms persist after you have completed your treatment plan.  Make sure you Understand these instructions. Will watch your condition. Will get help right away if you are not doing  well or get worse.  Your e-visit answers were reviewed by a board certified advanced clinical practitioner to complete your personal care plan.  Depending on the condition, your plan could have included both over the counter or prescription medications.  If there is a problem please reply  once you have received a response from your provider.  Your safety is important to us .  If you have drug allergies check your prescription carefully.    You can use MyChart to ask questions about todays visit, request a non-urgent call back, or ask for a work or school excuse for 24 hours related to this e-Visit. If it has been greater than 24 hours you will need to follow up with your provider, or enter a new e-Visit to address those concerns.  You will get an e-mail in the next two days asking about your experience.  I hope that your e-visit has been valuable and will speed your recovery. Thank you for using e-visits.  I have spent 5 minutes in review of e-visit questionnaire, review and updating patient chart, medical decision making and response to patient.   Hadassah Fireman, NP

## 2025-05-31 ENCOUNTER — Encounter: Admitting: Family Medicine
